# Patient Record
Sex: Male | Born: 2005 | Race: Black or African American | Hispanic: No | Marital: Single | Smoking: Never smoker
Health system: Southern US, Community
[De-identification: ages and names within clinical notes are randomized; demographics above are authoritative.]

---
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
## 2023-03-12 DIAGNOSIS — F32A Depression, unspecified: Secondary | ICD-10-CM | POA: Diagnosis not present

## 2023-04-02 DIAGNOSIS — F32A Depression, unspecified: Secondary | ICD-10-CM | POA: Diagnosis not present

## 2023-09-03 ENCOUNTER — Ambulatory Visit

## 2023-09-05 ENCOUNTER — Ambulatory Visit: Admitting: Student

## 2023-09-05 ENCOUNTER — Encounter: Payer: Self-pay | Admitting: Student

## 2023-09-05 VITALS — BP 119/69 | HR 82 | Ht 72.0 in | Wt 153.0 lb

## 2023-09-05 DIAGNOSIS — L309 Dermatitis, unspecified: Secondary | ICD-10-CM | POA: Diagnosis present

## 2023-09-05 MED ORDER — TRIAMCINOLONE ACETONIDE 0.5 % EX OINT: 1.0000 | TOPICAL_OINTMENT | Freq: Two times a day (BID) | CUTANEOUS | 0 refills | Status: DC

## 2023-09-05 NOTE — Progress Notes (Signed)
    SUBJECTIVE:   CHIEF COMPLAINT / HPI: Allergic rxn  Discussed the use of AI scribe software for clinical note transcription with the patient, who gave verbal consent to proceed.  History of Present Illness Frank Holden is a 18 year old male who presents with itchy skin lesions on his hands and right hip.  He has experienced itchy skin lesions on his hands for the past month and a half. The itchiness is more prominent than pain, and the lesions have not spread significantly. Hydrocortisone and antifungal creams have provided some improvement, but symptoms persist. The lesions do not bleed unless disturbed.  Similar itchy lesions are present on his hip area, with no other rashes or skin issues elsewhere on his body.  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 He has been in this job for approximately seven months.   PERTINENT  PMH / PSH: depression, allergic rhinitis  OBJECTIVE:   BP 119/69   Pulse 82   Ht 6' (1.829 m)   Wt 153 lb (69.4 kg)   SpO2 99%   BMI 20.75 kg/m   General: Well appearing, NAD, awake, alert, responsive to questions Head: Normocephalic atraumatic Respiratory: chest rises symmetrically,  no increased work of breathing Skin: intertriginous scaly lesions, dorsal surface of hand with scaly rash of hands. 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 Partial improvement with hydrocortisone and antifungal creams. Ddx dyshidrotic eczema vs contact dermatitis from cleaning chemicals. 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

## 2023-09-05 NOTE — Patient Instructions (Signed)
 It was great to see you! Thank you for allowing me to participate in your care!   Our plans for today:  - I have sent in triamcinolone  to do 2 times daily on hands - This appears to be contact related possibly from chemical, possibly from prolonged water exposure. 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  Genora Kidd, MD

## 2023-11-23 ENCOUNTER — Other Ambulatory Visit: Payer: Self-pay

## 2023-11-23 ENCOUNTER — Ambulatory Visit (HOSPITAL_COMMUNITY)
Admission: EM | Admit: 2023-11-23 | Discharge: 2023-11-24 | Disposition: A | Discharge status: Other Acute Inpatient Hospital | Attending: Nurse Practitioner | Admitting: Nurse Practitioner

## 2023-11-23 DIAGNOSIS — F419 Anxiety disorder, unspecified: Secondary | ICD-10-CM | POA: Insufficient documentation

## 2023-11-23 DIAGNOSIS — F332 Major depressive disorder, recurrent severe without psychotic features: Secondary | ICD-10-CM | POA: Insufficient documentation

## 2023-11-23 DIAGNOSIS — I4510 Unspecified right bundle-branch block: Secondary | ICD-10-CM | POA: Insufficient documentation

## 2023-11-23 DIAGNOSIS — R45851 Suicidal ideations: Secondary | ICD-10-CM | POA: Insufficient documentation

## 2023-11-23 DIAGNOSIS — Z9151 Personal history of suicidal behavior: Secondary | ICD-10-CM | POA: Insufficient documentation

## 2023-11-23 LAB — POCT URINE DRUG SCREEN - MANUAL ENTRY (I-SCREEN)
POC Amphetamine UR: NOT DETECTED
POC Buprenorphine (BUP): NOT DETECTED
POC Cocaine UR: NOT DETECTED
POC Marijuana UR: NOT DETECTED
POC Methadone UR: NOT DETECTED
POC Methamphetamine UR: NOT DETECTED
POC Morphine: NOT DETECTED
POC Oxazepam (BZO): NOT DETECTED
POC Oxycodone UR: NOT DETECTED
POC Secobarbital (BAR): NOT DETECTED

## 2023-11-23 MED ORDER — HYDROXYZINE HCL 25 MG PO TABS: 25.0000 mg | ORAL_TABLET | Freq: Three times a day (TID) | ORAL | Status: DC | PRN

## 2023-11-23 MED ORDER — DIPHENHYDRAMINE HCL 50 MG PO CAPS: 50.0000 mg | ORAL_CAPSULE | Freq: Three times a day (TID) | ORAL | Status: DC | PRN

## 2023-11-23 MED ORDER — DIPHENHYDRAMINE HCL 50 MG/ML IJ SOLN: 50.0000 mg | Freq: Three times a day (TID) | INTRAMUSCULAR | Status: DC | PRN

## 2023-11-23 MED ORDER — MAGNESIUM HYDROXIDE 400 MG/5ML PO SUSP: 30.0000 mL | Freq: Every day | ORAL | Status: DC | PRN

## 2023-11-23 MED ORDER — LORAZEPAM 2 MG/ML IJ SOLN: 2.0000 mg | Freq: Three times a day (TID) | INTRAMUSCULAR | Status: DC | PRN

## 2023-11-23 MED ORDER — TRAZODONE HCL 50 MG PO TABS
50.0000 mg | ORAL_TABLET | Freq: Every evening | ORAL | Status: DC | PRN
  Filled 2023-11-23: qty 1

## 2023-11-23 MED ORDER — ACETAMINOPHEN 325 MG PO TABS: 650.0000 mg | ORAL_TABLET | Freq: Four times a day (QID) | ORAL | Status: DC | PRN

## 2023-11-23 MED ORDER — ALUM & MAG HYDROXIDE-SIMETH 200-200-20 MG/5ML PO SUSP: 30.0000 mL | ORAL | Status: DC | PRN

## 2023-11-23 MED ORDER — HALOPERIDOL LACTATE 5 MG/ML IJ SOLN: 10.0000 mg | Freq: Three times a day (TID) | INTRAMUSCULAR | Status: DC | PRN

## 2023-11-23 MED ORDER — ARIPIPRAZOLE 5 MG PO TABS
5.0000 mg | ORAL_TABLET | Freq: Every day | ORAL | Status: DC
  Administered 2023-11-23: 5 mg via ORAL
  Filled 2023-11-23: qty 1

## 2023-11-23 MED ORDER — MELATONIN 5 MG PO TABS
5.0000 mg | ORAL_TABLET | Freq: Every evening | ORAL | Status: DC | PRN
Start: 2023-11-23 — End: 2023-11-24
  Administered 2023-11-23: 5 mg via ORAL
  Filled 2023-11-23: qty 1

## 2023-11-23 MED ORDER — HALOPERIDOL 5 MG PO TABS: 5.0000 mg | ORAL_TABLET | Freq: Three times a day (TID) | ORAL | Status: DC | PRN

## 2023-11-23 MED ORDER — HALOPERIDOL LACTATE 5 MG/ML IJ SOLN: 5.0000 mg | Freq: Three times a day (TID) | INTRAMUSCULAR | Status: DC | PRN

## 2023-11-23 NOTE — BH Assessment (Signed)
 Comprehensive Clinical Assessment (CCA) Note  11/23/2023 Frank Holden [id number]  DISPOSITION: Completed CCA accompanied by Roxianne Olp, NP who completed MSE and determined Pt meets criteria for inpatient psychiatric treatment. Pt will be admitted to [facility] for continuous assessment and AC at [facility] will review for possible admission.   The patient demonstrates the following risk factors for suicide: Chronic risk factors for suicide include: previous suicide attempts by hanging. Acute risk factors for suicide include: loss (financial, interpersonal, professional). Protective factors for this patient include: responsibility to others (children, family). Considering these factors, the overall suicide risk at this point appears to be high. Patient is not appropriate for outpatient follow up.  Pt presents to [facility] accompanied by his mother, who did not participate in assessment. Pt states he has experienced depressive symptoms since his great grandmother died 5 years ago. He says he has felt severely depressed for the past two months. He acknowledges that today he wrote a suicide note that read along the lines of it will all be over soon and I'm sorry and his sister saw it. He acknowledges suicidal ideation with no specific plan. He says approximately 1 year ago he attempted suicide by hanging himself, explaining he wrapped a belt around his neck and a doorknob but stopped himself.   Pt describes his mood as depressed and he appears anxious. He acknowledges crying spells, social withdrawal, and feelings of hopelessness. He says he has racing thoughts and frequently worries. He states he often stays awake until 5 am and will sleep until noon. He denies problems with appetite. He denies homicidal ideation or history of violence. He denies experiencing auditory or visual hallucinations. He states he tried marijuana a couple of months ago but denies recent use. He denies use of alcohol or other  substances.  Pt identifies grieving the death of his great grandmother as his primary stressor. He lives with his mother, stepfather and three younger siblings. He says his mother has a history of depression and he does not know any other family history. He graduated from high school and works at [employer]. He says he distracts himself from depressive thoughts by playing video games or watching YouTube. He denies history of abuse. He denies any legal history. He denies access to firearms.   Pt says he participated in therapy for the approximately 6 months starting in November 2024. He found it somewhat helpful. He says he has never been prescribed psychiatric medications. He has no history of inpatient psychiatric treatment.  Pt is casually dressed and well-groomed. He is alert and oriented x4. Pt speaks in a clear tone, at moderate volume and normal pace. Motor behavior appears mildly tremulous. Eye contact is good. Pt's mood is depressed and affect is anxious and depressed. Thought process is coherent and relevant. There is no indication he is currently responding to internal stimuli or experiencing delusional thought content. He is cooperative and says he is willing to sign voluntarily into a psychiatric facility and willing to take medication because he wants to improve his life.  Chief Complaint:  Chief Complaint  Patient presents with   Suicidal Ideation   Visit Diagnosis: F33.2 Major depressive disorder, Recurrent episode, Severe   CCA Screening, Triage and Referral (STR)  Patient Reported Information How did you hear about us ? Family/Friend  What Is the Reason for Your Visit/Call Today? Pt presents to [facility] as a voluntary walk-in, accompanied by his mother with complaint of SI, with no plan/intent. Pt reports that he wrote  a note earlier today and his sister saw it. He states that note read along the lines of it will all be over soon and I'm sorry. Pt reports having ongoing SI  since the passing of his grandmother. Pt reports prior suicide attempt by hanging, but he was unable to remember when that occurred.  How Long Has This Been Causing You Problems? 1-6 months  What Do You Feel Would Help You the Most Today? Treatment for Depression or other mood problem   Have You Recently Had Any Thoughts About Hurting Yourself? Yes  Are You Planning to Commit Suicide/Harm Yourself At This time? Yes   Flowsheet Row ED from 11/23/2023 in [facility] ED to Hosp-Admission (Discharged) from 12/04/2020 in [facility]  C-SSRS RISK CATEGORY Moderate Risk No Risk    Have you Recently Had Thoughts About Hurting Someone [place]? No  Are You Planning to Harm Someone at This Time? No  Explanation: Pt reports suicidal ideation and acknowledges he wrote a suicide note earlier today. He does not articulate a specific plan but attempted to hang himself last year.   Have You Used Any Alcohol or Drugs in the Past 24 Hours? No  How Long Ago Did You Use Drugs or Alcohol? No data recorded What Did You Use and How Much? No data recorded  Do You Currently Have a Therapist/Psychiatrist? No  Name of Therapist/Psychiatrist:    Have You Been Recently Discharged From Any Office Practice or Programs? No  Explanation of Discharge From Practice/Program: No data recorded    CCA Screening Triage Referral Assessment Type of Contact: Face-to-Face  Telemedicine Service Delivery:   Is this Initial or Reassessment?   Date Telepsych consult ordered in [facility]:    Time Telepsych consult ordered in [facility]:    Location of Assessment: [facility] Assessment Services  Provider Location: [facility] Assessment [facility]   Collateral Involvement: None   Does Patient Have a Automotive engineer Guardian? No  Legal Guardian Contact Information: Pt does not have a legal guardian  Copy of Legal Guardianship Form: -- (Pt does not have a legal guardian)  Legal  Guardian Notified of Arrival: -- (Pt does not have a legal guardian)  Legal Guardian Notified of Pending Discharge: -- (Pt does not have a legal guardian)  If Minor and Not Living with Parent(s), Who has Custody? Pt is an adult  Is CPS involved or ever been involved? Never  Is APS involved or ever been involved? Never   Patient Determined To Be At Risk for Harm To Self or Others Based on Review of Patient Reported Information or Presenting Complaint? Yes, for Self-Harm (Pt reports suicidal ideation and acknowledges he wrote a suicide note earlier today. He does not articulate a specific plan but attempted to hang himself last year. He denies thoughts of harming others.)  Method: No Plan  Availability of Means: No access or NA  Intent: Clearly intends on inflicting harm that could cause death  Notification Required: No need or identified person  Additional Information for Danger to Others Potential: -- (No history of violence)  Additional Comments for Danger to Others Potential: Pt has no history of violence  Are There Guns or Other Weapons in Your Home? No  Types of Guns/Weapons: Pt denies access to firearms.  Are These Weapons Safely Secured?                            -- (  Pt denies access to firearms.)  Who Could Verify You Are Able To Have These Secured: Pt denies access to firearms.  Do You Have any Outstanding Charges, Pending Court Dates, Parole/Probation? Pt denies legal history.  Contacted To Inform of Risk of Harm To Self or Others: Family/Significant Other:    Does Patient Present under Involuntary Commitment? No    [place] of Residence: [place]   Patient Currently Receiving the Following Services: Not Receiving Services   Determination of Need: Urgent (48 hours)   Options For Referral: Inpatient Hospitalization; [facility] Urgent Care     CCA Biopsychosocial Patient Reported Schizophrenia/Schizoaffective Diagnosis in Past: No   Strengths: Pt is  motivated for treatment and has family support   Mental Health Symptoms Depression:  Change in energy/activity; Hopelessness   Duration of Depressive symptoms: Duration of Depressive Symptoms: Greater than two weeks   Mania:  None   Anxiety:   Sleep; Tension; Worrying   Psychosis:  None   Duration of Psychotic symptoms:    Trauma:  None   Obsessions:  None   Compulsions:  None   Inattention:  None   Hyperactivity/Impulsivity:  None   Oppositional/Defiant Behaviors:  None   Emotional Irregularity:  None   Other Mood/Personality Symptoms:  None noted    Mental Status Exam Appearance and self-care  Stature:  Average   Weight:  Average weight   Clothing:  Casual   Grooming:  Well-groomed   Cosmetic use:  Age appropriate   Posture/gait:  Normal   Motor activity:  Not Remarkable   Sensorium  Attention:  Normal   Concentration:  Normal   Orientation:  X5   Recall/memory:  Normal   Affect and Mood  Affect:  Anxious; Depressed   Mood:  Anxious; Depressed   Relating  Eye contact:  Normal   Facial expression:  Anxious   Attitude toward examiner:  Cooperative   Thought and Language  Speech flow: Normal   Thought content:  Appropriate to Mood and Circumstances   Preoccupation:  None   Hallucinations:  None   Organization:  Coherent   [employer] of Knowledge:  Average   Intelligence:  Average   Abstraction:  Normal   Judgement:  Normal   Reality Testing:  Realistic   Insight:  Fair   Decision Making:  Normal   Social Functioning  Social Maturity:  Responsible   Social Judgement:  Normal   Stress  Stressors:  Grief/losses   Coping Ability:  Normal   Skill Deficits:  None   Supports:  Family; Friends/Service system     Religion: Religion/Spirituality Are You A Religious Person?: Yes What is Your Religious Affiliation?: Christian How Might This Affect Treatment?: Unknown  Leisure/Recreation: Leisure /  Recreation Do You Have Hobbies?: Yes Leisure and Hobbies: Video games, watching YouTube  Exercise/Diet: Exercise/Diet Do You Exercise?: Yes What Type of Exercise Do You Do?: Weight Training How Many Times a Week Do You Exercise?: 1-3 times a week Have You Gained or Lost A Significant Amount of Weight in the Past Six Months?: No Do You Follow a Special Diet?: No Do You Have Any Trouble Sleeping?: Yes Explanation of Sleeping Difficulties: Pt says he does not sleep until 5am and will sleep until noon   CCA Employment/Education Employment/Work Situation: Employment / Work Situation Employment Situation: Employed Work Stressors: None reported Patient's Job has Been Impacted by Current Illness: No Has Patient ever Been in Equities trader?: No  Education: Education Is Patient Currently Attending School?:  No Last Grade Completed: 12 Did You Attend College?: No Did You Have An Individualized Education Program (IIEP): No Did You Have Any Difficulty At School?: No Patient's Education Has Been Impacted by Current Illness: No   CCA Family/Childhood History Family and Relationship History: Family history Marital status: Single Does patient have children?: No  Childhood History:  Childhood History By whom was/is the patient raised?: Mother/father and step-parent, Mother Did patient suffer any verbal/emotional/physical/sexual abuse as a child?: No Did patient suffer from severe childhood neglect?: No Has patient ever been sexually abused/assaulted/raped as an adolescent or adult?: No Was the patient ever a victim of a crime or a disaster?: No Witnessed domestic violence?: No Has patient been affected by domestic violence as an adult?: No       CCA Substance Use Alcohol/Drug Use: Alcohol / Drug Use Pain Medications: Denies abuse Prescriptions: Denies abuse Over the Counter: Denies abuse History of alcohol / drug use?: Yes Longest period of sobriety (when/how long):  NA Negative Consequences of Use:  (NA) Withdrawal Symptoms: None                         ASAM's:  Six Dimensions of Multidimensional Assessment  Dimension 1:  Acute Intoxication and/or Withdrawal Potential:   Dimension 1:  Description of individual's past and current experiences of substance use and withdrawal: Pt says he tried marijuana a couple of months ago.  Dimension 2:  Biomedical Conditions and Complications:   Dimension 2:  Description of patient's biomedical conditions and  complications: None  Dimension 3:  Emotional, Behavioral, or Cognitive Conditions and Complications:  Dimension 3:  Description of emotional, behavioral, or cognitive conditions and complications: Pt reports depressive symptoms  Dimension 4:  Readiness to Change:  Dimension 4:  Description of Readiness to Change criteria: It is not currently using any substances  Dimension 5:  Relapse, Continued use, or Continued Problem Potential:  Dimension 5:  Relapse, continued use, or continued problem potential critiera description: It is not currently using any substances  Dimension 6:  Recovery/Living Environment:  Dimension 6:  Recovery/Iiving environment criteria description: Lives with mother, stepfather, siblings.  ASAM Severity Score: ASAM's Severity Rating Score: 3  ASAM Recommended Level of Treatment: ASAM Recommended Level of Treatment:  (NA)   Substance use Disorder (SUD) Substance Use Disorder (SUD)  Checklist Symptoms of Substance Use:  (NA)  Recommendations for Services/Supports/Treatments: Recommendations for Services/Supports/Treatments Recommendations For Services/Supports/Treatments: Inpatient Hospitalization  Disposition Recommendation per psychiatric provider: We recommend inpatient psychiatric hospitalization when medically cleared. Patient is under voluntary admission status at this time; please IVC if attempts to leave hospital.   DSM5 Diagnoses: Patient Active Problem List    Diagnosis Date Noted   Mood changes 07/29/2022   Status post surgery 12/04/2020   Acute appendicitis 12/04/2020   Allergic rhinitis 11/09/2011   Snoring 04/05/2009     Referrals to Alternative Service(s): Referred to Alternative Service(s):   Place:   Date:   Time:    Referred to Alternative Service(s):   Place:   Date:   Time:    Referred to Alternative Service(s):   Place:   Date:   Time:    Referred to Alternative Service(s):   Place:   Date:   Time:     Vivi Mickey Prim[place], [facility]

## 2023-11-23 NOTE — ED Provider Notes (Signed)
 [facility] Continuous Assessment Admission H&P  Date: 11/23/23 Patient Name: Danon Cardenas MRN: [id number] Chief Complaint: suicidal ideation  Diagnoses:  Final diagnoses:  Severe episode of recurrent major depressive disorder, without psychotic features (HCC)    HPI: Yurem Korver is a 18 y/o male with psychiatric history of depression presenting to [facility] as a walk in voluntarily accompanied by his mother Alberta Cairns with complaints of worsening depression/anxiety symptoms and suicidal ideation over the last few months. Patient wrote a suicidal note this morning stating it will all be over soon.    Chaise Busk, 18 y.o., male patient seen face to face by this provider and chart reviewed on 11/23/23.  On evaluation Aadin Gaut reports that he has been feeling depressed for about 5 years which is when his great-grandmother passed away. Patient stated, my mental health is getting worse. Patient reports that he was in therapy for about 6 months but it was only somewhat helpful. Patient reports that he attempted to hang himself last year with tying a belt around his neck and his door. Patient states that he did not disclose to anyone that   Patient reports that he tries to distract himself by playing video games. Patient reports poor sleep staying up to 4-5 am each morning due to racing thoughts, self-isolation, anhedonia, hopelessness and worthlessness, crying spells and irritability. Patient denies any previous trauma history. Patient does not have an outpatient medication provider or therapist at this time. Patient denies ever being prescribed psychiatric medications. Patient reports that he used to smoke marijuana but he stopped smoking a few month ago. Patient denies any other illicit substances.   During evaluation Tanush Hegel is sitting in the assessment in no acute distress.  He is alert, oriented x 4, calm, cooperative, pleasant and attentive.  His mood is very depressed and  anxious with congruent affect.  Patient appears visibly nervous during the assessment with his leg shaking during most of the assessment. Patient speaks at a low volume and behavior is calm.  Objectively there is no evidence of psychosis/mania or delusional thinking.  Patient is able to converse coherently, goal directed thoughts, no distractibility, or pre-occupation. He endorses suicidal ideation at this time but no specific plan or intent. Patient denies any psychosis or paranoia.  Patient answered question appropriately.     Total Time spent with patient: 20 minutes  Musculoskeletal  Strength & Muscle Tone: within normal limits Gait & Station: normal Patient leans: N/A  Psychiatric Specialty Exam  Presentation General Appearance:  Casual  Eye Contact: Good  Speech: Clear and Coherent  Speech Volume: Normal  Handedness: Right   Mood and Affect  Mood: Depressed  Affect: Appropriate   Thought Process  Thought Processes: Coherent  Descriptions of Associations:Intact  Orientation:Full (Time, Place and Person)  Thought Content:WDL  Diagnosis of Schizophrenia or Schizoaffective disorder in past: No   Hallucinations:Hallucinations: None  Ideas of Reference:None  Suicidal Thoughts:Suicidal Thoughts: Yes, Passive SI Passive Intent and/or Plan: Without Intent; Without Plan  Homicidal Thoughts:Homicidal Thoughts: No   Sensorium  Memory: Immediate Fair; Recent Fair; Remote Fair  Judgment: Fair  Insight: Fair   Executive Functions  Concentration: Good  Attention Span: Good  Recall: Good  Fund of Knowledge: Good  Language: Good   Psychomotor Activity  Psychomotor Activity: Psychomotor Activity: Normal   Assets  Assets: Communication Skills; Resilience; Housing; Social Support; Financial Resources/Insurance   Sleep  Sleep: Sleep: Poor Number of Hours of Sleep: 4   Nutritional Assessment (For  OBS and FBC admissions only) Has the  patient had a weight loss or gain of 10 pounds or more in the last 3 months?: No Has the patient had a decrease in food intake/or appetite?: No Does the patient have dental problems?: No Does the patient have eating habits or behaviors that may be indicators of an eating disorder including binging or inducing vomiting?: No Has the patient recently lost weight without trying?: 0 Has the patient been eating poorly because of a decreased appetite?: 1 Malnutrition Screening Tool Score: 1    Physical Exam HENT:     Head: Normocephalic.     Nose: Nose normal.  Eyes:     Pupils: Pupils are equal, round, and reactive to light.  Cardiovascular:     Rate and Rhythm: Normal rate.  Pulmonary:     Effort: Pulmonary effort is normal.  Abdominal:     General: Abdomen is flat.  Musculoskeletal:        General: Normal range of motion.     Cervical back: Normal range of motion.  Skin:    General: Skin is warm.  Neurological:     Mental Status: He is alert and oriented to person, place, and time.  Psychiatric:        Attention and Perception: Attention normal.        Mood and Affect: Mood is anxious and depressed.        Speech: Speech normal.        Behavior: Behavior is cooperative.        Thought Content: Thought content is not paranoid or delusional. Thought content includes suicidal ideation. Thought content does not include homicidal ideation. Thought content does not include homicidal or suicidal plan.        Cognition and Memory: Cognition normal.        Judgment: Judgment is impulsive.    Review of Systems  Constitutional: Negative.   HENT: Negative.    Eyes: Negative.   Respiratory: Negative.    Cardiovascular: Negative.   Gastrointestinal: Negative.   Genitourinary: Negative.   Musculoskeletal: Negative.   Skin: Negative.   Neurological: Negative.   Psychiatric/Behavioral:  Positive for depression and suicidal ideas. The patient is nervous/anxious.     Blood pressure  120/84, pulse 73, temperature 98.1 F (36.7 C), temperature source Oral, resp. rate 20, SpO2 100%. There is no height or weight on file to calculate BMI.  Past Psychiatric History: Depression  Is the patient at risk to self? Yes  Has the patient been a risk to self in the past 6 months? No .    Has the patient been a risk to self within the distant past? Yes   Is the patient a risk to others? No   Has the patient been a risk to others in the past 6 months? No   Has the patient been a risk to others within the distant past? No   Past Medical History: Appendectomy  Family History: Mother-depression/anxiety  Social History: 18 y/o male lives at home with his mother and step-father and three younger siblings and works at [employer], high school graduate  Last Labs:  No visits with results within 6 Month(s) from this visit.  Latest known visit with results is:  Admission on 12/04/2020, Discharged on 12/07/2020  Component Date Value Ref Range Status   Lipase 12/04/2020 25  11 - 51 U/L Final   Performed at [facility], [address], [place], [place] [address]  Sodium 12/04/2020 133 (L)  135 - 145 mmol/L Final   Potassium 12/04/2020 4.6  3.5 - 5.1 mmol/L Final   Chloride 12/04/2020 97 (L)  98 - 111 mmol/L Final   CO2 12/04/2020 23  22 - 32 mmol/L Final   Glucose, Bld 12/04/2020 142 (H)  70 - 99 mg/dL Final   Glucose reference range applies only to samples taken after fasting for at least 8 hours.   BUN 12/04/2020 13  4 - 18 mg/dL Final   Creatinine, Ser 12/04/2020 1.12 (H)  0.50 - 1.00 mg/dL Final   Calcium 92/68/7977 9.3  8.9 - 10.3 mg/dL Final   Total Protein 92/68/7977 8.7 (H)  6.5 - 8.1 g/dL Final   Albumin  12/04/2020 4.8  3.5 - 5.0 g/dL Final   AST 92/68/7977 31  15 - 41 U/L Final   ALT 12/04/2020 14  0 - 44 U/L Final   Alkaline Phosphatase 12/04/2020 121  52 - 171 U/L Final   Total Bilirubin 12/04/2020 1.3 (H)  0.3 - 1.2 mg/dL Final   GFR, Estimated  12/04/2020 NOT CALCULATED  >60 mL/min Final   Comment: (NOTE) Calculated using the CKD-EPI Creatinine Equation (2021)    Anion gap 12/04/2020 13  5 - 15 Final   Performed at [facility], [address] [place], [place] [address]   WBC 12/04/2020 5.4  4.5 - 13.5 K/uL Final   RBC 12/04/2020 5.62  3.80 - 5.70 MIL/uL Final   Hemoglobin 12/04/2020 15.1  12.0 - 16.0 g/dL Final   HCT 92/68/7977 46.6  36.0 - 49.0 % Final   MCV 12/04/2020 82.9  78.0 - 98.0 fL Final   MCH 12/04/2020 26.9  25.0 - 34.0 pg Final   MCHC 12/04/2020 32.4  31.0 - 37.0 g/dL Final   RDW 92/68/7977 13.3  11.4 - 15.5 % Final   Platelets 12/04/2020 234  150 - 400 K/uL Final   nRBC 12/04/2020 0.0  0.0 - 0.2 % Final   Performed at [facility], [address] [place], [place] [address]   Color, Urine 12/05/2020 YELLOW  YELLOW Final   APPearance 12/05/2020 CLEAR  CLEAR Final   Specific Gravity, Urine 12/05/2020 >1.046 (H)  1.005 - 1.030 Final   pH 12/05/2020 6.0  5.0 - 8.0 Final   Glucose, UA 12/05/2020 NEGATIVE  NEGATIVE mg/dL Final   Hgb urine dipstick 12/05/2020 NEGATIVE  NEGATIVE Final   Bilirubin Urine 12/05/2020 NEGATIVE  NEGATIVE Final   Ketones, ur 12/05/2020 NEGATIVE  NEGATIVE mg/dL Final   Protein, ur 91/98/7977 30 (A)  NEGATIVE mg/dL Final   Nitrite 91/98/7977 NEGATIVE  NEGATIVE Final   Leukocytes,Ua 12/05/2020 NEGATIVE  NEGATIVE Final   RBC / HPF 12/05/2020 0-5  0 - 5 RBC/hpf Final   WBC, UA 12/05/2020 0-5  0 - 5 WBC/hpf Final   Bacteria, UA 12/05/2020 NONE SEEN  NONE SEEN Final   Squamous Epithelial / HPF 12/05/2020 0-5  0 - 5 Final   Mucus 12/05/2020 PRESENT   Final   Performed at [facility], [address], [place], [place] [address]   SARS Coronavirus 2 by RT PCR 12/04/2020 NEGATIVE  NEGATIVE Final   Comment: (NOTE) SARS-CoV-2 target nucleic acids are NOT DETECTED.  The SARS-CoV-2 RNA is generally detectable in upper respiratory specimens during the acute phase of  infection. The lowest concentration of SARS-CoV-2 viral copies this assay can detect is 138 copies/mL. A negative result does not preclude SARS-Cov-2 infection and  should not be used as the sole basis for treatment or other patient management decisions. A negative result may occur with  improper specimen collection/handling, submission of specimen other than nasopharyngeal swab, presence of viral mutation(s) within the areas targeted by this assay, and inadequate number of viral copies(<138 copies/mL). A negative result must be combined with clinical observations, patient history, and epidemiological information. The expected result is Negative.  Fact Sheet for Patients:  BloggerCourse.com  Fact Sheet for Healthcare Providers:  SeriousBroker.it  This test is no                          t yet approved or cleared by the [place]  FDA and  has been authorized for detection and/or diagnosis of SARS-CoV-2 by FDA under an Emergency Use Authorization (EUA). This EUA will remain  in effect (meaning this test can be used) for the duration of the COVID-19 declaration under Section 564(b)(1) of the Act, 21 U.S.C.section 360bbb-3(b)(1), unless the authorization is terminated  or revoked sooner.       Influenza A by PCR 12/04/2020 NEGATIVE  NEGATIVE Final   Influenza B by PCR 12/04/2020 NEGATIVE  NEGATIVE Final   Comment: (NOTE) The Xpert Xpress SARS-CoV-2/FLU/RSV plus assay is intended as an aid in the diagnosis of influenza from Nasopharyngeal swab specimens and should not be used as a sole basis for treatment. Nasal washings and aspirates are unacceptable for Xpert Xpress SARS-CoV-2/FLU/RSV testing.  Fact Sheet for Patients: BloggerCourse.com  Fact Sheet for Healthcare Providers: SeriousBroker.it  This test is not yet approved or cleared by the [place]  FDA and has been  authorized for detection and/or diagnosis of SARS-CoV-2 by FDA under an Emergency Use Authorization (EUA). This EUA will remain in effect (meaning this test can be used) for the duration of the COVID-19 declaration under Section 564(b)(1) of the Act, 21 U.S.C. section 360bbb-3(b)(1), unless the authorization is terminated or revoked.     Resp Syncytial Virus by PCR 12/04/2020 NEGATIVE  NEGATIVE Final   Comment: (NOTE) Fact Sheet for Patients: BloggerCourse.com  Fact Sheet for Healthcare Providers: SeriousBroker.it  This test is not yet approved or cleared by the [place]  FDA and has been authorized for detection and/or diagnosis of SARS-CoV-2 by FDA under an Emergency Use Authorization (EUA). This EUA will remain in effect (meaning this test can be used) for the duration of the COVID-19 declaration under Section 564(b)(1) of the Act, 21 U.S.C. section 360bbb-3(b)(1), unless the authorization is terminated or revoked.  Performed at [facility], [address] [place], [place] [address]    Sodium 12/05/2020 134 (L)  135 - 145 mmol/L Final   Potassium 12/05/2020 3.8  3.5 - 5.1 mmol/L Final   Chloride 12/05/2020 102  98 - 111 mmol/L Final   CO2 12/05/2020 26  22 - 32 mmol/L Final   Glucose, Bld 12/05/2020 122 (H)  70 - 99 mg/dL Final   Glucose reference range applies only to samples taken after fasting for at least 8 hours.   BUN 12/05/2020 11  4 - 18 mg/dL Final   Creatinine, Ser 12/05/2020 1.03 (H)  0.50 - 1.00 mg/dL Final   Calcium 91/98/7977 8.6 (L)  8.9 - 10.3 mg/dL Final   GFR, Estimated 12/05/2020 NOT CALCULATED  >60 mL/min Final   Comment: (NOTE) Calculated using the CKD-EPI Creatinine Equation (2021)    Anion gap 12/05/2020 6  5 - 15 Final   Performed at [facility], [address], [place], [place] [address]   SURGICAL PATHOLOGY 12/05/2020    Final-Edited                   Value:SURGICAL  PATHOLOGY CASE: [id number] PATIENT: LAMONTE HOYLE Surgical Pathology Report     Clinical History: Appendicitis (nt)     FINAL MICROSCOPIC DIAGNOSIS:  A. APPENDIX, APPENDECTOMY: - Acute appendicitis with perforation and serositis.   GROSS DESCRIPTION:  Specimen: Appendix, received fresh Size: 7.7 cm in length and up to 1 cm in diameter Serosa: The serosa is dull tan-red and partially covered by an exudate. Mucosa: Focally hemorrhagic Wall: There is a perforation of the wall in the midportion. Lumen: Patent Block Summary: 1 block submitted to include the inked margin (GRP 12/05/2020)    Final Diagnosis performed by Recardo Likens, MD.   Electronically signed 12/06/2020 Technical component performed at [facility]. [facility], [address] [place], [place] [address].  Professional component performed at [facility], [address], [place], [place] [address].  Immunohistochemistry Technical component (if applicable) w                         as performed at [facility]. [address], [place], [place] [address].   IMMUNOHISTOCHEMISTRY DISCLAIMER (if applicable): Some of these immunohistochemical stains may have been developed and the performance characteristics determine by [facility]. Some may not have been cleared or approved by the U.S. Food and Drug Administration. The FDA has determined that such clearance or approval is not necessary. This test is used for clinical purposes. It should not be regarded as investigational or for research. This laboratory is certified under the Clinical Laboratory Improvement Amendments of 1988 (CLIA-88) as qualified to perform high complexity clinical laboratory testing.  The controls stained appropriately.    WBC 12/06/2020 5.5  4.5 - 13.5 K/uL Final   RBC 12/06/2020 3.84  3.80 - 5.70 MIL/uL Final   Hemoglobin 12/06/2020 10.6 (L)  12.0 - 16.0 g/dL Final   HCT  91/97/7977 32.3 (L)  36.0 - 49.0 % Final   MCV 12/06/2020 84.1  78.0 - 98.0 fL Final   MCH 12/06/2020 27.6  25.0 - 34.0 pg Final   MCHC 12/06/2020 32.8  31.0 - 37.0 g/dL Final   RDW 91/97/7977 13.4  11.4 - 15.5 % Final   Platelets 12/06/2020 169  150 - 400 K/uL Final   nRBC 12/06/2020 0.0  0.0 - 0.2 % Final   Performed at [facility], [address], [place], [place] [address]    Allergies: Patient has no known allergies.  Medications:  Facility Ordered Medications  Medication   acetaminophen  (TYLENOL ) tablet 650 mg   alum & mag hydroxide-simeth (MAALOX/MYLANTA) 200-200-20 MG/5ML suspension 30 mL   magnesium  hydroxide (MILK OF MAGNESIA) suspension 30 mL   haloperidol  (HALDOL ) tablet 5 mg   And   diphenhydrAMINE  (BENADRYL ) capsule 50 mg   haloperidol  lactate (HALDOL ) injection 5 mg   And   diphenhydrAMINE  (BENADRYL ) injection 50 mg   And   LORazepam  (ATIVAN ) injection 2 mg   haloperidol  lactate (HALDOL ) injection 10 mg   And   diphenhydrAMINE  (BENADRYL ) injection 50 mg   And   LORazepam  (ATIVAN ) injection 2 mg   hydrOXYzine  (ATARAX ) tablet 25 mg   traZODone  (DESYREL ) tablet 50 mg   PTA Medications  Medication Sig   loratadine  (CLARITIN ) 10 MG tablet Take 1  tablet (10 mg total) by mouth daily.   acetaminophen  (TYLENOL ) 500 MG tablet Take 2 tablets (1,000 mg total) by mouth every 6 (six) hours as needed.   triamcinolone  ointment (KENALOG ) 0.5 % Apply 1 Application topically 2 (two) times daily. For moderate to severe eczema.  Do not use for more than 1 week at a time.      Medical Decision Making  Aldred Rutledge is a 18 y/o male presenting to [facility] as a walk in voluntarily accompanied by his mother Alric Geise with complaints of worsening depression/anxiety symptoms and suicidal ideation over the last few months. Patient wrote a suicidal note this morning stating it will all be over soon    Recommendations  Based on my evaluation the patient does not appear  to have an emergency medical condition. Patient recommended for inpatient hospitalization and will be admitted to [facility] continuous observation for crisis management, safety and stabilization until an inpatient bed is available.   Amyrie Illingworth E Alfonzia Woolum, NP 11/23/23  10:30 PM

## 2023-11-23 NOTE — Progress Notes (Signed)
   11/23/23 2112  [facility] Triage Screening (Walk-ins at [facility] only)  How Did You Hear About Us ? Family/Friend  What Is the Reason for Your Visit/Call Today? Pt presents to [facility] as a voluntary walk-in, accompanied by his mother with complaint of SI, with no plan/intent. Pt reports that he wrote a note earlier today and his sister saw it. He states that note read along the lines of it will all be over soon and I'm sorry. Pt denies having a plan, however he thinks about not being a lot more than he should.  Pt reports having ongoing SI since the passing of his grandmother. Pt reports prior suicide attempt by hanging, but he was unable to remember when that occurred. Pt is observed fidgety at times, lack of eye contact, but pleasant. Pt denies being established with outpatient resources. Pt denies taking prescribed medications at this time. Pt currently denies HI,AVH and substance/alcohol use.  How Long Has This Been Causing You Problems? 1-6 months  Have You Recently Had Any Thoughts About Hurting Yourself? Yes  How long ago did you have thoughts about hurting yourself? currently  Are You Planning to Commit Suicide/Harm Yourself At This time? No  Have you Recently Had Thoughts About Hurting Someone Sherral? No  Are You Planning To Harm Someone At This Time? No  Physical Abuse Denies  Verbal Abuse Denies  Sexual Abuse Denies  Exploitation of patient/patient's resources Denies  Self-Neglect Denies  Possible abuse reported to:  (NA)  Are you currently experiencing any auditory, visual or other hallucinations? No  Have You Used Any Alcohol or Drugs in the Past 24 Hours? No  Do you have any current medical co-morbidities that require immediate attention? No  Clinician description of patient physical appearance/behavior: Cooperative, lacks eye contact, appears fidgety or slightly paranoid  What Do You Feel Would Help You the Most Today? Treatment for Depression or other mood problem;Medication(s)  If access  to [facility] was not available, would you have sought care in the Emergency Department? Yes  Determination of Need Urgent (48 hours)  Options For Referral Other: Comment;[facility] Urgent Care;Outpatient Therapy;Medication Management  Determination of Need filed? Yes

## 2023-11-24 ENCOUNTER — Encounter: Payer: Self-pay | Admitting: Nurse Practitioner

## 2023-11-24 ENCOUNTER — Other Ambulatory Visit: Payer: Self-pay

## 2023-11-24 ENCOUNTER — Inpatient Hospital Stay
Admission: AD | Admit: 2023-11-24 | Discharge: 2023-11-29 | DRG: 885 | Disposition: A | Discharge status: Home / Self Care | Source: Intra-hospital | Attending: Psychiatry | Admitting: Psychiatry

## 2023-11-24 DIAGNOSIS — F333 Major depressive disorder, recurrent, severe with psychotic symptoms: Secondary | ICD-10-CM | POA: Diagnosis not present

## 2023-11-24 DIAGNOSIS — Z9151 Personal history of suicidal behavior: Secondary | ICD-10-CM | POA: Diagnosis not present

## 2023-11-24 DIAGNOSIS — F411 Generalized anxiety disorder: Secondary | ICD-10-CM | POA: Diagnosis present

## 2023-11-24 DIAGNOSIS — R45851 Suicidal ideations: Secondary | ICD-10-CM | POA: Diagnosis present

## 2023-11-24 DIAGNOSIS — F332 Major depressive disorder, recurrent severe without psychotic features: Secondary | ICD-10-CM | POA: Diagnosis present

## 2023-11-24 LAB — COMPREHENSIVE METABOLIC PANEL WITH GFR
ALT: 16 U/L (ref 0–44)
AST: 25 U/L (ref 15–41)
Albumin: 4.3 g/dL (ref 3.5–5.0)
Alkaline Phosphatase: 74 U/L (ref 38–126)
Anion gap: 10 (ref 5–15)
BUN: 8 mg/dL (ref 6–20)
CO2: 24 mmol/L (ref 22–32)
Calcium: 9.5 mg/dL (ref 8.9–10.3)
Chloride: 102 mmol/L (ref 98–111)
Creatinine, Ser: 1.12 mg/dL (ref 0.61–1.24)
GFR, Estimated: >60 mL/min (ref >60)
Glucose, Bld: 140 mg/dL — ABNORMAL HIGH (ref 70–99)
Potassium: 4 mmol/L (ref 3.5–5.1)
Sodium: 136 mmol/L (ref 135–145)
Total Bilirubin: 1 mg/dL (ref 0.0–1.2)
Total Protein: 7.2 g/dL (ref 6.5–8.1)

## 2023-11-24 LAB — CBC WITH DIFFERENTIAL/PLATELET
Abs Immature Granulocytes: 0.02 K/uL (ref 0.00–0.07)
Basophils Absolute: 0 K/uL (ref 0.0–0.1)
Basophils Relative: 0 %
Eosinophils Absolute: 0.2 K/uL (ref 0.0–0.5)
Eosinophils Relative: 4 %
HCT: 41 % (ref 39.0–52.0)
Hemoglobin: 13.6 g/dL (ref 13.0–17.0)
Immature Granulocytes: 0 %
Lymphocytes Relative: 20 %
Lymphs Abs: 1.1 K/uL (ref 0.7–4.0)
MCH: 27.2 pg (ref 26.0–34.0)
MCHC: 33.2 g/dL (ref 30.0–36.0)
MCV: 82 fL (ref 80.0–100.0)
Monocytes Absolute: 0.4 K/uL (ref 0.1–1.0)
Monocytes Relative: 7 %
Neutro Abs: 4 K/uL (ref 1.7–7.7)
Neutrophils Relative %: 69 %
Platelets: 227 K/uL (ref 150–400)
RBC: 5 MIL/uL (ref 4.22–5.81)
RDW: 13 % (ref 11.5–15.5)
WBC: 5.8 K/uL (ref 4.0–10.5)
nRBC: 0 % (ref 0.0–0.2)

## 2023-11-24 LAB — MAGNESIUM: Magnesium: 2.1 mg/dL (ref 1.7–2.4)

## 2023-11-24 LAB — ETHANOL: Alcohol, Ethyl (B): <15 mg/dL (ref <15)

## 2023-11-24 MED ORDER — ALUM & MAG HYDROXIDE-SIMETH 200-200-20 MG/5ML PO SUSP: 30.0000 mL | ORAL | Status: DC | PRN

## 2023-11-24 MED ORDER — ESCITALOPRAM OXALATE 10 MG PO TABS
10.0000 mg | ORAL_TABLET | Freq: Every day | ORAL | Status: DC
Start: 2023-11-24 — End: 2023-11-29
  Administered 2023-11-24 – 2023-11-29 (×6): 10 mg via ORAL
  Filled 2023-11-24 (×6): qty 1

## 2023-11-24 MED ORDER — DIPHENHYDRAMINE HCL 25 MG PO CAPS: 50.0000 mg | ORAL_CAPSULE | Freq: Three times a day (TID) | ORAL | Status: DC | PRN

## 2023-11-24 MED ORDER — HALOPERIDOL 5 MG PO TABS: 5.0000 mg | ORAL_TABLET | Freq: Three times a day (TID) | ORAL | Status: DC | PRN

## 2023-11-24 MED ORDER — HALOPERIDOL LACTATE 5 MG/ML IJ SOLN: 10.0000 mg | Freq: Three times a day (TID) | INTRAMUSCULAR | Status: DC | PRN

## 2023-11-24 MED ORDER — DIPHENHYDRAMINE HCL 50 MG/ML IJ SOLN: 50.0000 mg | Freq: Three times a day (TID) | INTRAMUSCULAR | Status: DC | PRN

## 2023-11-24 MED ORDER — ACETAMINOPHEN 325 MG PO TABS: 650.0000 mg | ORAL_TABLET | Freq: Four times a day (QID) | ORAL | Status: DC | PRN

## 2023-11-24 MED ORDER — MAGNESIUM HYDROXIDE 400 MG/5ML PO SUSP: 30.0000 mL | Freq: Every day | ORAL | Status: DC | PRN

## 2023-11-24 MED ORDER — ARIPIPRAZOLE 5 MG PO TABS
5.0000 mg | ORAL_TABLET | Freq: Every day | ORAL | Status: DC
  Administered 2023-11-24 – 2023-11-29 (×6): 5 mg via ORAL
  Filled 2023-11-24 (×6): qty 1

## 2023-11-24 MED ORDER — LORAZEPAM 2 MG/ML IJ SOLN: 2.0000 mg | Freq: Three times a day (TID) | INTRAMUSCULAR | Status: DC | PRN

## 2023-11-24 MED ORDER — HALOPERIDOL LACTATE 5 MG/ML IJ SOLN: 5.0000 mg | Freq: Three times a day (TID) | INTRAMUSCULAR | Status: DC | PRN

## 2023-11-24 MED ORDER — HYDROXYZINE HCL 25 MG PO TABS: 25.0000 mg | ORAL_TABLET | Freq: Three times a day (TID) | ORAL | Status: DC | PRN

## 2023-11-24 MED ORDER — MELATONIN 5 MG PO TABS
5.0000 mg | ORAL_TABLET | Freq: Every evening | ORAL | Status: DC | PRN
  Administered 2023-11-24 – 2023-11-28 (×5): 5 mg via ORAL
  Filled 2023-11-24 (×5): qty 1

## 2023-11-24 NOTE — [facility] Counselor (Signed)
 Adult Comprehensive Assessment  Patient ID: Frank Holden, male   DOB: 05-Nov-2005, 18 y.o.   MRN: [id number]  Information Source: Information source: Patient  Current Stressors:  Patient states their primary concerns and needs for treatment are:: The patient stated to make sure he is no longer having racing thoughts, SI and depression. Patient states their goals for this hospitilization and ongoing recovery are:: The patient stated to do better with his health, get outside more and not keep things bottled up. Educational / Learning stressors: None reported. Employment / Job issues: The patient stated that he will be starting new job tomorrow. Family Relationships: None reported. Financial / Lack of resources (include bankruptcy): None reported. Housing / Lack of housing: None repirted. Physical health (include injuries & life threatening diseases): None reported. Social relationships: None reported. Substance abuse: None reported Bereavement / Loss: None reported.  Living/Environment/Situation:  Living Arrangements: Parent, Other relatives Living conditions (as described by patient or guardian): The patient satted fine Who else lives in the home?: The patient stated mom, stepdad, and siblings. How long has patient lived in current situation?: The patient stated since last year. What is atmosphere in current home: Comfortable  Family History:  Marital status: Single Are you sexually active?: No What is your sexual orientation?: The patient stated straight. Does patient have children?: No  Childhood History:  By whom was/is the patient raised?: Grandparents Additional childhood history information: The patient satted that his mom was more present then his father. Description of patient's relationship with caregiver when they were a child: The patient stated that they had a strong relationship. Patient's description of current relationship with people who raised him/her: The patient  stated that his great grandmpther passed and he has a pretty good relationship with his mother. How were you disciplined when you got in trouble as a child/adolescent?: The patient stated normal whoopings and things taken away. Does patient have siblings?: Yes Number of Siblings: 3 Description of patient's current relationship with siblings: The patient stated that its not perfect but good. 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 How Long has Patient Been Employed?: Will start in a few days. Are You Satisfied With Your Job?: Yes Do You Work More Than One Job?: No Work Stressors: None reported Patient's Job has Been Impacted by Current Illness: No What is the Longest Time Patient has Held a Job?: The patient stated 10 months Where was the Patient Employed at that Time?: Yhe patient stated  [employer] Has Patient ever Been in the [employer]?: No  Financial Resources:   Financial resources: Income from employment, Medicaid Does patient have a representative payee or guardian?: No  Alcohol/Substance Abuse:   What has been your use of drugs/alcohol within the last 12 months?: the patient stated none. If attempted suicide, did drugs/alcohol play a role in this?: No Alcohol/Substance Abuse Treatment Hx: Denies past history Has alcohol/substance abuse ever caused legal  problems?: No  Social Support System:  Patient's Community Support System: Production assistant, radio System: the patient they are good with spotten something wrong Type of faith/religion: the patient stated christian How does patient's faith help to cope with current illness?: The patient stated prayer  Leisure/Recreation:   Do You Have Hobbies?: Yes Leisure and Hobbies: The patient stated reading comics and exercise.  Strengths/Needs:   Patient states these barriers may affect/interfere with their treatment: None reported Patient states these barriers may affect their return to the community: None reported Other important information patient would like considered in planning for their treatment: None reported.  Discharge Plan:   Currently receiving community mental health services: No (The patient stated that his therapy ended a couple of months ago, mental health crisi in [place].) Patient states concerns and preferences for aftercare planning are: The patient stated that he dont know yet. Patient states they will know when they are safe and ready for discharge when: The patient stated that he feel good about himself. Does patient have access to transportation?: Yes Does patient have financial barriers related to discharge medications?: No Patient description of barriers related to discharge medications: None reported. Will patient be returning to same living situation after discharge?: Yes  Summary/Recommendations:     The patient is a 18 year old male patient from [place] [facility]) who presented to [facility] as a voluntary walk-in, accompanied by his mother with complaint of SI, with no plan/intent.  The patient reports that he has been feeling depressed for about 5 years which is when his great-grandmother passed away. Patient stated, my mental health is getting worse. The patient stated that he has been experiencing depression racing thoughts, and SI. Patient  reports that he was in therapy for about 6 months at a mental health crisis unit in [place]. The patient stated he was not sure if he wanted to start therapy again being that he just completed therapy. The patient denies any stress factors. The patient denies any use of drugs or alcohol. The patient stated that he was just hired at [employer] a [employer]. The patient states he receives Medicaid. The patient states he has access to transport and will be returning home. Recommendations include crisis stabilization, therapeutic milieu, encourage group attendance and participation, medication management for detox/mood stabilization and development of comprehensive mental wellness.   Frank Holden. 11/24/2023

## 2023-11-24 NOTE — Group Note (Signed)
 Date:  11/24/2023 Time:  7:36 PM  Group Topic/Focus:  Activity Group: The focus of the group is to promote activity for the patients and encourage them to go outside and get some fresh air and some exercise.    Participation Level:  Did Not Attend   Camellia HERO Mahika Vanvoorhis 11/24/2023, 7:36 PM

## 2023-11-24 NOTE — [facility] Suicide Risk Assessment (Signed)
 [facility] Admission Suicide Risk Assessment   Nursing information obtained from:  Patient Demographic factors:  Age 18 or older Current Mental Status:  Suicidal ideation indicated by patient Loss Factors:  Decrease in vocational status Historical Factors:  Impulsivity Risk Reduction Factors:  Positive social support  Total Time spent with patient: 1 hour Principal Problem: MDD (major depressive disorder), recurrent episode, severe (HCC) Diagnosis:  Principal Problem:   MDD (major depressive disorder), recurrent episode, severe (HCC)  Subjective Data: The patient is a 18 year old male with no prior psychiatric medication history, presenting with worsening depressive symptoms, generalized anxiety, racing thoughts, and passive suicidal ideation without plan or intent, in the context of unresolved grief and probable mood instability. His clinical picture is significant for chronic depression, episodic mood elevation, sleep disturbance, and prior suicide attempt.  He demonstrates limited coping skills, poor social engagement, and minimal psychiatric follow-up despite prior engagement in therapy. His physical environment appears stable, and he has recent employment, suggesting some functional capacity. However, his ongoing passive SI and impaired sleep present notable safety concerns. Rule out bipolar disorder. PT started on abilify  in ED will continue. Will also start lexapro  for anxiety.  Continued Clinical Symptoms:    The Alcohol Use Disorders Identification Test, Guidelines for Use in Primary Care, Second Edition.  World Science writer [facility]). Score between 0-7:  no or low risk or alcohol related problems. Score between 8-15:  moderate risk of alcohol related problems. Score between 16-19:  high risk of alcohol related problems. Score 20 or above:  warrants further diagnostic evaluation for alcohol dependence and treatment.   CLINICAL FACTORS:   Severe Anxiety and/or  Agitation Depression:   Anhedonia Hopelessness Insomnia Severe   Musculoskeletal: Strength & Muscle Tone: within normal limits Gait & Station: normal Patient leans: N/A  Psychiatric Specialty Exam:  Presentation  General Appearance:  Casual  Eye Contact: Good  Speech: Clear and Coherent  Speech Volume: Normal  Handedness: Right   Mood and Affect  Mood: Depressed  Affect: Appropriate   Thought Process  Thought Processes: Coherent  Descriptions of Associations:Intact  Orientation:Full (Time, Place and Person)  Thought Content:WDL  History of Schizophrenia/Schizoaffective disorder:No  Duration of Psychotic Symptoms:No data recorded Hallucinations:Hallucinations: None  Ideas of Reference:None  Suicidal Thoughts:Suicidal Thoughts: Yes, Passive SI Passive Intent and/or Plan: Without Intent; Without Plan  Homicidal Thoughts:Homicidal Thoughts: No   Sensorium  Memory: Immediate Fair; Recent Fair; Remote Fair  Judgment: Fair  Insight: Fair   Executive Functions  Concentration: Good  Attention Span: Good  Recall: Good  Fund of Knowledge: Good  Language: Good   Psychomotor Activity  Psychomotor Activity: Psychomotor Activity: Normal   Assets  Assets: Communication Skills; Resilience; Housing; Social Support; Financial Resources/Insurance   Sleep  Sleep: Sleep: Poor Number of Hours of Sleep: 4    Physical Exam: Physical Exam ROS Blood pressure 134/72, pulse 76, temperature 97.7 F (36.5 C), resp. rate 18, SpO2 100%. There is no height or weight on file to calculate BMI.   COGNITIVE FEATURES THAT CONTRIBUTE TO RISK:  None    SUICIDE RISK:   Moderate:  Frequent suicidal ideation with limited intensity, and duration, some specificity in terms of plans, no associated intent, good self-control, limited dysphoria/symptomatology, some risk factors present, and identifiable protective factors, including available and  accessible social support.  PLAN OF CARE:   Safety and Monitoring:   --  Voluntary admission to inpatient psychiatric unit for safety, stabilization and treatment -- Daily contact with patient to assess and  evaluate symptoms and progress in treatment -- Patient's case to be discussed in multi-disciplinary team meeting -- Observation Level : q15 minute checks -- Vital signs:  q12 hours -- Precautions: suicide   I certify that inpatient services furnished can reasonably be expected to improve the patient's condition.   Donnice FORBES Right, PA-C 11/24/2023, 4:53 PM

## 2023-11-24 NOTE — ED Notes (Incomplete)
 Pt presents to [facility] as a voluntary walk-in, accompanied by his mother with complaint of SI, with no plan/intent. Pt reports that he wrote a note earlier today and his sister saw it. He states that note read along the lines of it will all be over soon and I'm sorry. Pt reports having ongoing SI since the passing of his grandmother. Pt reports prior suicide attempt by hanging, but he was unable to remember when that occurre

## 2023-11-24 NOTE — ED Notes (Signed)
 Pt  admitted as voluntary walk-in, accompanied by his mother with complaint of SI, with no plan/intent. Pt reports that he wrote a note earlier today and his sister saw it. He states that note read along the lines of it will all be over soon and I'm sorry. Pt reports having ongoing SI since the passing of his grandmother. Pt reports prior suicide attempt by hanging, but he was unable to remember when that occurred. Pt with sad affect and guarded. Pt denies si/hi/avh.  Cooperative with admission process and skin check. No contraband. Pt verbally contracted to safety. Pt oriented to unit and offered no concerns. Pt made phone call to let Mom know he was okay.  Pt offered meal and fluids with acceptance. Plan is for pt to transfer to [facility] for further treatment. Consent signed. Will continue to monitor and report changes as noted

## 2023-11-24 NOTE — ED Notes (Signed)
 Patient observed/assessed in bed/chair resting quietly appearing in no distress and verbalizing no complaints at this time. Will continue to monitor.

## 2023-11-24 NOTE — Group Note (Signed)
 Date:  11/24/2023 Time:  11:10 AM  Group Topic/Focus:  Self Esteem Action Plan:   The focus of this group is to help patients create a plan to continue to build self-esteem after discharge.    Participation Level:  Did Not Attend   Camellia HERO Ariyanna Oien 11/24/2023, 11:10 AM

## 2023-11-24 NOTE — H&P (Signed)
 Psychiatric Admission Assessment Adult  Patient Identification: Frank Holden MRN:  [id number] Date of Evaluation:  11/24/2023 Chief Complaint:  MDD (major depressive disorder), recurrent episode, severe (HCC) [F33.2]   History of Present Illness:   The patient is a 18 year old single male from [place], [place] , who presented voluntarily to [facility] accompanied by his mother due to worsening depression, suicidal ideation without current plan or intent, and racing thoughts. He reports experiencing depressive symptoms for the past five years, which he attributes to the death of his great-grandmother. He describes his mental health as "getting worse" and endorses daily feelings of sadness, hopelessness, worthlessness, irritability, anhedonia, crying spells, and self-isolation. He reports significant difficulty sleeping, staying awake until 4-5 AM due to racing thoughts. He also endorses episodes of increased energy and mood lability a few times per week, followed by depression and anxiety.  The patient reports a prior suicide attempt approximately one year ago, involving an attempted hanging with a belt; he states he did not disclose this event to anyone at the time. Also reports more recent attempt but is guarded and does not further discuss. He denies current plan or intent but acknowledges ongoing suicidal thoughts.  The patient reports a prior history of therapy for approximately six months at a local crisis center but felt it was only somewhat helpful. He has no history of psychiatric medication use and denies current engagement with any outpatient mental health provider. He reports he recently ceased marijuana use after previously using intermittently but denies any other illicit substance use or current alcohol use.  He resides with his mother, stepfather, and siblings in a comfortable home environment and describes his relationships with family as generally positive. He reports that he was  primarily raised by his grandparents during childhood. He is not sexually active, has no children, and denies any history of abuse, neglect, or trauma.  The patient reports starting a new job at [employer]) in the next few days and denies work-related stress at this time. He enjoys reading comics and exercising as hobbies. He identifies as [place] and uses prayer as a coping mechanism.  At presentation, the patient is visibly anxious, speaking softly and hesitantly, with leg shaking noted throughout the assessment. His affect is anxious and mood depressed, but he remains cooperative and attentive. No psychosis, paranoia, or disorganized thinking observed during evaluation.His anxiety prevents him from speaking at times but he is cooperative. Struggles in group environments.   Total Time spent with patient: 1 hour Sleep  Sleep:Sleep: Poor Number of Hours of Sleep: 4  Past Psychiatric History:   Psychiatric History:  Information collected from pt  Prev Dx/Sx: none Current Psych Provider: none Home Meds (current): none Previous Med Trials: none Therapy: prior  Prior Psych Hospitalization: none  Prior Self Harm: See above Prior Violence: none  Family Psych History: none reported Family Hx suicide: none  Social History:  Developmental Hx: none reported Educational Hx: high school graduate Occupational Hx: temp Legal Hx: none Living Situation: with family  Spiritual Hx: chirstian Access to weapons/lethal means: none   Substance History Alcohol: denie   Illicit drugs: denies Prescription drug abuse: none Rehab hx: none Is the patient at risk to self? Yes.    Has the patient been a risk to self in the past 6 months? Yes.    Has the patient been a risk to self within the distant past? Yes.    Is the patient a risk to others? No.  Has the  patient been a risk to others in the past 6 months? No.  Has the patient been a risk to others within the distant past? No.    [name] Scale:  Flowsheet Row Admission (Current) from 11/24/2023 in [facility] INPATIENT BEH[facility] ED from 11/23/2023 in [facility] ED to Hosp-Admission (Discharged) from 12/04/2020 in [facility]  C-SSRS RISK CATEGORY Low Risk Low Risk No Risk     Past Medical History: History reviewed. No pertinent past medical history.  Past Surgical History:  Procedure Laterality Date   LAPAROSCOPIC APPENDECTOMY N/A 12/05/2020   Procedure: APPENDECTOMY LAPAROSCOPIC;  Surgeon: Sebastian Moles, MD;  Location: [facility] OR;  Service: General;  Laterality: N/A;   Family History: History reviewed. No pertinent family history.  Social History:  Social History   Substance and Sexual Activity  Alcohol Use No     Social History   Substance and Sexual Activity  Drug Use No      Allergies:  No Known Allergies Lab Results:  Results for orders placed or performed during the hospital encounter of 11/23/23 (from the past 48 hours)  CBC with Differential/Platelet     Status: None   Collection Time: 11/23/23 10:38 PM  Result Value Ref Range   WBC 5.8 4.0 - 10.5 K/uL   RBC 5.00 4.22 - 5.81 MIL/uL   Hemoglobin 13.6 13.0 - 17.0 g/dL   HCT 58.9 60.9 - 47.9 %   MCV 82.0 80.0 - 100.0 fL   MCH 27.2 26.0 - 34.0 pg   MCHC 33.2 30.0 - 36.0 g/dL   RDW 86.9 88.4 - 84.4 %   Platelets 227 150 - 400 K/uL   nRBC 0.0 0.0 - 0.2 %   Neutrophils Relative % 69 %   Neutro Abs 4.0 1.7 - 7.7 K/uL   Lymphocytes Relative 20 %   Lymphs Abs 1.1 0.7 - 4.0 K/uL   Monocytes Relative 7 %   Monocytes Absolute 0.4 0.1 - 1.0 K/uL   Eosinophils Relative 4 %   Eosinophils Absolute 0.2 0.0 - 0.5 K/uL   Basophils Relative 0 %   Basophils Absolute 0.0 0.0 - 0.1 K/uL   Immature Granulocytes 0 %   Abs Immature Granulocytes 0.02 0.00 - 0.07 K/uL    Comment: Performed at [facility] Lab, [address], [place], [place] [address]  Comprehensive metabolic panel     Status:  Abnormal   Collection Time: 11/23/23 10:38 PM  Result Value Ref Range   Sodium 136 135 - 145 mmol/L   Potassium 4.0 3.5 - 5.1 mmol/L   Chloride 102 98 - 111 mmol/L   CO2 24 22 - 32 mmol/L   Glucose, Bld 140 (H) 70 - 99 mg/dL    Comment: Glucose reference range applies only to samples taken after fasting for at least 8 hours.   BUN 8 6 - 20 mg/dL   Creatinine, Ser 8.87 0.61 - 1.24 mg/dL   Calcium 9.5 8.9 - 89.6 mg/dL   Total Protein 7.2 6.5 - 8.1 g/dL   Albumin  4.3 3.5 - 5.0 g/dL   AST 25 15 - 41 U/L   ALT 16 0 - 44 U/L   Alkaline Phosphatase 74 38 - 126 U/L   Total Bilirubin 1.0 0.0 - 1.2 mg/dL   GFR, Estimated >39 >39 mL/min    Comment: (NOTE) Calculated using the CKD-EPI Creatinine Equation (2021)    Anion gap 10 5 - 15    Comment: Performed at [facility], [address] [place], [place] [address]  Magnesium      Status: None   Collection Time: 11/23/23 10:38 PM  Result Value Ref Range   Magnesium  2.1 1.7 - 2.4 mg/dL    Comment: Performed at [facility], [address] [place], [place] [address]  Ethanol     Status: None   Collection Time: 11/23/23 10:38 PM  Result Value Ref Range   Alcohol, Ethyl (B) <15 <15 mg/dL    Comment: (NOTE) For medical purposes only. Performed at [facility], [address], [place], [place] [address]   POCT Urine Drug Screen - (I-Screen)     Status: None   Collection Time: 11/23/23 10:42 PM  Result Value Ref Range   POC Amphetamine UR None Detected NONE DETECTED (Cut Off Level 1000 ng/mL)   POC Secobarbital (BAR) None Detected NONE DETECTED (Cut Off Level 300 ng/mL)   POC Buprenorphine (BUP) None Detected NONE DETECTED (Cut Off Level 10 ng/mL)   POC Oxazepam (BZO) None Detected NONE DETECTED (Cut Off Level 300 ng/mL)   POC Cocaine UR None Detected NONE DETECTED (Cut Off Level 300 ng/mL)   POC Methamphetamine UR None Detected NONE DETECTED (Cut Off Level 1000 ng/mL)   POC Morphine  None Detected NONE DETECTED (Cut Off  Level 300 ng/mL)   POC Methadone UR None Detected NONE DETECTED (Cut Off Level 300 ng/mL)   POC Oxycodone  UR None Detected NONE DETECTED (Cut Off Level 100 ng/mL)   POC Marijuana UR None Detected NONE DETECTED (Cut Off Level 50 ng/mL)    Blood Alcohol level:  Lab Results  Component Value Date   [facility] <15 11/23/2023    Metabolic Disorder Labs:  No results found for: HGBA1C, MPG No results found for: PROLACTIN No results found for: CHOL, TRIG, HDL, CHOLHDL, VLDL, LDLCALC  Current Medications: Current Facility-Administered Medications  Medication Dose Route Frequency Provider Last Rate Last Admin   acetaminophen  (TYLENOL ) tablet 650 mg  650 mg Oral Q6H PRN Bobbitt, Shalon E, NP       alum & mag hydroxide-simeth (MAALOX/MYLANTA) 200-200-20 MG/5ML suspension 30 mL  30 mL Oral Q4H PRN Bobbitt, Shalon E, NP       ARIPiprazole  (ABILIFY ) tablet 5 mg  5 mg Oral Daily Bobbitt, Shalon E, NP   5 mg at 11/24/23 0847   haloperidol  (HALDOL ) tablet 5 mg  5 mg Oral TID PRN Bobbitt, Shalon E, NP       And   diphenhydrAMINE  (BENADRYL ) capsule 50 mg  50 mg Oral TID PRN Bobbitt, Shalon E, NP       haloperidol  lactate (HALDOL ) injection 5 mg  5 mg Intramuscular TID PRN Bobbitt, Shalon E, NP       And   diphenhydrAMINE  (BENADRYL ) injection 50 mg  50 mg Intramuscular TID PRN Bobbitt, Shalon E, NP       And   LORazepam  (ATIVAN ) injection 2 mg  2 mg Intramuscular TID PRN Bobbitt, Shalon E, NP       haloperidol  lactate (HALDOL ) injection 10 mg  10 mg Intramuscular TID PRN Bobbitt, Shalon E, NP       And   diphenhydrAMINE  (BENADRYL ) injection 50 mg  50 mg Intramuscular TID PRN Bobbitt, Shalon E, NP       And   LORazepam  (ATIVAN ) injection 2 mg  2 mg Intramuscular TID PRN Bobbitt, Shalon E, NP       hydrOXYzine  (ATARAX ) tablet 25 mg  25 mg Oral TID  PRN Bobbitt, Shalon E, NP       magnesium  hydroxide (MILK OF MAGNESIA) suspension 30 mL  30 mL Oral Daily PRN Bobbitt, Shalon E, NP        melatonin tablet 5 mg  5 mg Oral QHS PRN Bobbitt, Shalon E, NP       PTA Medications: Medications Prior to Admission  Medication Sig Dispense Refill Last Dose/Taking   acetaminophen  (TYLENOL ) 500 MG tablet Take 2 tablets (1,000 mg total) by mouth every 6 (six) hours as needed.      loratadine  (CLARITIN ) 10 MG tablet Take 1 tablet (10 mg total) by mouth daily. 30 tablet 11    triamcinolone  ointment (KENALOG ) 0.5 % Apply 1 Application topically 2 (two) times daily. For moderate to severe eczema.  Do not use for more than 1 week at a time. 60 g 0     Psychiatric Specialty Exam:  Presentation  General Appearance:  Casual  Eye Contact: Good  Speech: Clear and Coherent  Speech Volume: Normal    Mood and Affect  Mood: Depressed  Affect: Appropriate   Thought Process  Thought Processes: Coherent  Descriptions of Associations:Intact  Orientation:Full (Time, Place and Person)  Thought Content:WDL  Hallucinations:Hallucinations: None  Ideas of Reference:None  Suicidal Thoughts:Suicidal Thoughts: Yes, Passive SI Passive Intent and/or Plan: Without Intent; Without Plan  Homicidal Thoughts:Homicidal Thoughts: No   Sensorium  Memory: Immediate Fair; Recent Fair; Remote Fair  Judgment: Fair  Insight: Fair   Executive Functions  Concentration: Good  Attention Span: Good  Recall: Good  Fund of Knowledge: Good  Language: Good   Psychomotor Activity  Psychomotor Activity: Psychomotor Activity: Normal   Assets  Assets: Communication Skills; Resilience; Housing; Social Support; Financial Resources/Insurance    Musculoskeletal: Strength & Muscle Tone: within normal limits Gait & Station: normal  Physical Exam: Physical Exam Vitals and nursing note reviewed.  HENT:     Head: Atraumatic.  Eyes:     Extraocular Movements: Extraocular movements intact.  Pulmonary:     Effort: Pulmonary effort is normal.  Neurological:     Mental Status:  He is alert and oriented to person, place, and time.    Review of Systems  Psychiatric/Behavioral:  Positive for depression and suicidal ideas. Negative for substance abuse. The patient is nervous/anxious and has insomnia.    Blood pressure 118/73, pulse 81, temperature 98.1 F (36.7 C), resp. rate 18, SpO2 98%. There is no height or weight on file to calculate BMI.  Principal Diagnosis: MDD (major depressive disorder), recurrent episode, severe (HCC) Diagnosis:  Principal Problem:   MDD (major depressive disorder), recurrent episode, severe (HCC)   Clinical Decision Making:  Treatment Plan Summary:  The patient is a 18 year old male with no prior psychiatric medication history, presenting with worsening depressive symptoms, generalized anxiety, racing thoughts, and passive suicidal ideation without plan or intent, in the context of unresolved grief and probable mood instability. His clinical picture is significant for chronic depression, episodic mood elevation, sleep disturbance, and prior suicide attempt.  He demonstrates limited coping skills, poor social engagement, and minimal psychiatric follow-up despite prior engagement in therapy. His physical environment appears stable, and he has recent employment, suggesting some functional capacity. However, his ongoing passive SI and impaired sleep present notable safety concerns. Rule out bipolar disorder. PT started on abilify  in ED will continue. Will also start lexapro  for anxiety.  Safety and Monitoring:             -- Voluntary admission to inpatient  psychiatric unit for safety, stabilization and treatment             -- Daily contact with patient to assess and evaluate symptoms and progress in treatment             -- Patient's case to be discussed in multi-disciplinary team meeting             -- Observation Level: q15 minute checks             -- Vital signs:  q12 hours             -- Precautions: suicide, elopement, and assault    2. Psychiatric Diagnoses and Treatment:              Unspecified mood disorder  -- Continue abilify  5 mg daily  -- Start lexapro  10 mg daily      -- The risks/benefits/side-effects/alternatives to this medication were discussed in detail with the patient and time was given for questions. The patient consents to medication trial.                -- Metabolic profile and EKG monitoring obtained while on an atypical antipsychotic (BMI: Lipid Panel: HbgA1c: QTc:)              -- Encouraged patient to participate in unit milieu and in scheduled group therapies                            3. Medical Issues Being Addressed:     no acute concerns.  4. Discharge Planning:              -- Social work and case management to assist with discharge planning and identification of hospital follow-up needs prior to discharge             -- Estimated LOS: 5-7 days             -- Discharge Concerns: Need to establish a safety plan; Medication compliance and effectiveness             -- Discharge Goals: Return home with outpatient referrals follow ups  Physician Treatment Plan for Primary Diagnosis: MDD (major depressive disorder), recurrent episode, severe (HCC) Long Term Goal(s): Improvement in symptoms so as ready for discharge  Short Term Goals: Ability to verbalize feelings will improve, Ability to disclose and discuss suicidal ideas, Ability to identify and develop effective coping behaviors will improve, Ability to maintain clinical measurements within normal limits will improve, Compliance with prescribed medications will improve, and Ability to identify triggers associated with substance abuse/mental health issues will improve   I certify that inpatient services furnished can reasonably be expected to improve the patient's condition.    Donnice FORBES Right, PA-C 7/20/20251:46 PM

## 2023-11-24 NOTE — Progress Notes (Signed)
   11/24/23 0900  Psych Admission Type (Psych Patients Only)  Admission Status Voluntary  Psychosocial Assessment  Patient Complaints None  Eye Contact Fair  Facial Expression Worried  Affect Preoccupied (patient worried about losing his new temporary job.)  Systems analyst;Soft  Interaction Cautious  Motor Activity Slow  Appearance/Hygiene Unremarkable  Behavior Characteristics Cooperative;Appropriate to situation  Mood Preoccupied;Pleasant  Aggressive Behavior  Effect No apparent injury  Thought Process  Coherency WDL  Content WDL  Delusions None reported or observed  Perception WDL  Hallucination None reported or observed  Judgment WDL  Confusion None  Danger to Self  Current suicidal ideation? Denies  Self-Injurious Behavior No self-injurious ideation or behavior indicators observed or expressed   Agreement Not to Harm Self Yes  Description of Agreement Verbal  Danger to Others  Danger to Others None reported or observed   Patient stated that he is feeling better than yesterday. Patient reports that he has not had any racing thoughts so far and that I feel great about myself.

## 2023-11-24 NOTE — BH IP Treatment Plan (Signed)
 Interdisciplinary Treatment and Diagnostic Plan Update  11/24/2023 Time of Session: 10:26 AM  Frank Holden MRN: [id number]  Principal Diagnosis: MDD (major depressive disorder), recurrent episode, severe (HCC)  Secondary Diagnoses: Principal Problem:   MDD (major depressive disorder), recurrent episode, severe (HCC)   Current Medications:  Current Facility-Administered Medications  Medication Dose Route Frequency Provider Last Rate Last Admin   acetaminophen  (TYLENOL ) tablet 650 mg  650 mg Oral Q6H PRN Bobbitt, Shalon E, NP       alum & mag hydroxide-simeth (MAALOX/MYLANTA) 200-200-20 MG/5ML suspension 30 mL  30 mL Oral Q4H PRN Bobbitt, Shalon E, NP       ARIPiprazole  (ABILIFY ) tablet 5 mg  5 mg Oral Daily Bobbitt, Shalon E, NP   5 mg at 11/24/23 0847   haloperidol  (HALDOL ) tablet 5 mg  5 mg Oral TID PRN Bobbitt, Shalon E, NP       And   diphenhydrAMINE  (BENADRYL ) capsule 50 mg  50 mg Oral TID PRN Bobbitt, Shalon E, NP       haloperidol  lactate (HALDOL ) injection 5 mg  5 mg Intramuscular TID PRN Bobbitt, Shalon E, NP       And   diphenhydrAMINE  (BENADRYL ) injection 50 mg  50 mg Intramuscular TID PRN Bobbitt, Shalon E, NP       And   LORazepam  (ATIVAN ) injection 2 mg  2 mg Intramuscular TID PRN Bobbitt, Shalon E, NP       haloperidol  lactate (HALDOL ) injection 10 mg  10 mg Intramuscular TID PRN Bobbitt, Shalon E, NP       And   diphenhydrAMINE  (BENADRYL ) injection 50 mg  50 mg Intramuscular TID PRN Bobbitt, Shalon E, NP       And   LORazepam  (ATIVAN ) injection 2 mg  2 mg Intramuscular TID PRN Bobbitt, Shalon E, NP       hydrOXYzine  (ATARAX ) tablet 25 mg  25 mg Oral TID PRN Bobbitt, Shalon E, NP       magnesium  hydroxide (MILK OF MAGNESIA) suspension 30 mL  30 mL Oral Daily PRN Bobbitt, Shalon E, NP       melatonin tablet 5 mg  5 mg Oral QHS PRN Bobbitt, Shalon E, NP       PTA Medications: Medications Prior to Admission  Medication Sig Dispense Refill Last Dose/Taking    acetaminophen  (TYLENOL ) 500 MG tablet Take 2 tablets (1,000 mg total) by mouth every 6 (six) hours as needed.      loratadine  (CLARITIN ) 10 MG tablet Take 1 tablet (10 mg total) by mouth daily. 30 tablet 11    triamcinolone  ointment (KENALOG ) 0.5 % Apply 1 Application topically 2 (two) times daily. For moderate to severe eczema.  Do not use for more than 1 week at a time. 60 g 0     Patient Stressors:    Patient Strengths:    Treatment Modalities: Medication Management, Group therapy, Case management,  1 to 1 session with clinician, Psychoeducation, Recreational therapy.   Physician Treatment Plan for Primary Diagnosis: MDD (major depressive disorder), recurrent episode, severe (HCC) Long Term Goal(s):     Short Term Goals:    Medication Management: Evaluate patient's response, side effects, and tolerance of medication regimen.  Therapeutic Interventions: 1 to 1 sessions, Unit Group sessions and Medication administration.  Evaluation of Outcomes: Not Progressing  Physician Treatment Plan for Secondary Diagnosis: Principal Problem:   MDD (major depressive disorder), recurrent episode, severe (HCC)  Long Term Goal(s):     Short Term Goals:  Medication Management: Evaluate patient's response, side effects, and tolerance of medication regimen.  Therapeutic Interventions: 1 to 1 sessions, Unit Group sessions and Medication administration.  Evaluation of Outcomes: Not Progressing   RN Treatment Plan for Primary Diagnosis: MDD (major depressive disorder), recurrent episode, severe (HCC) Long Term Goal(s): Knowledge of disease and therapeutic regimen to maintain health will improve  Short Term Goals: Ability to remain free from injury will improve, Ability to verbalize frustration and anger appropriately will improve, Ability to demonstrate self-control, Ability to participate in decision making will improve, Ability to verbalize feelings will improve, Ability to disclose and  discuss suicidal ideas, Ability to identify and develop effective coping behaviors will improve, and Compliance with prescribed medications will improve  Medication Management: RN will administer medications as ordered by provider, will assess and evaluate patient's response and provide education to patient for prescribed medication. RN will report any adverse and/or side effects to prescribing provider.  Therapeutic Interventions: 1 on 1 counseling sessions, Psychoeducation, Medication administration, Evaluate responses to treatment, Monitor vital signs and CBGs as ordered, Perform/monitor CIWA, COWS, AIMS and Fall Risk screenings as ordered, Perform wound care treatments as ordered.  Evaluation of Outcomes: Not Progressing   LCSW Treatment Plan for Primary Diagnosis: MDD (major depressive disorder), recurrent episode, severe (HCC) Long Term Goal(s): Safe transition to appropriate next level of care at discharge, Engage patient in therapeutic group addressing interpersonal concerns.  Short Term Goals: Engage patient in aftercare planning with referrals and resources, Increase social support, Increase ability to appropriately verbalize feelings, Increase emotional regulation, Facilitate acceptance of mental health diagnosis and concerns, Facilitate patient progression through stages of change regarding substance use diagnoses and concerns, Identify triggers associated with mental health/substance abuse issues, and Increase skills for wellness and recovery  Therapeutic Interventions: Assess for all discharge needs, 1 to 1 time with Social worker, Explore available resources and support systems, Assess for adequacy in community support network, Educate family and significant other(s) on suicide prevention, Complete Psychosocial Assessment, Interpersonal group therapy.  Evaluation of Outcomes: Not Progressing   Progress in Treatment: Attending groups: Yes. and No. Participating in groups: Yes. and  No. Taking medication as prescribed: Yes. Toleration medication: Yes. Family/Significant other contact made: No, will contact:  CSW will contact if given permission  Patient understands diagnosis: Yes. Discussing patient identified problems/goals with staff: No. Medical problems stabilized or resolved: Yes. Denies suicidal/homicidal ideation: Yes. Issues/concerns per patient self-inventory: No. Other: None   New problem(s) identified: No, Describe:  None identified   New Short Term/Long Term Goal(s): elimination of symptoms of psychosis, medication management for mood stabilization; elimination of SI thoughts; development of comprehensive mental wellness plan.   Patient Goals:  Pt does not report a goal, pt remained silent throughout tx team.   Discharge Plan or Barriers: CSW will assist with appropriate discharge planning   Reason for Continuation of Hospitalization: Depression Medication stabilization  Estimated Length of Stay: 1 to 7 days  Last 3 [name] Suicide Severity Risk Score: Flowsheet Row Admission (Current) from 11/24/2023 in [facility] ED from 11/23/2023 in [facility] ED to Hosp-Admission (Discharged) from 12/04/2020 in [facility] P[facility]RICS  C-SSRS RISK CATEGORY Low Risk Low Risk No Risk    Last PHQ 2/9 Scores:    09/05/2023    2:30 PM 07/27/2022    3:50 PM 12/15/2018   11:15 AM  Depression screen PHQ 2/9  Decreased Interest 0 2 2  Down, Depressed, Hopeless 0 3 2  PHQ - 2 Score 0 5 4  Altered sleeping 0 2 1  Tired, decreased energy 0 2 1  Change in appetite 0 0 0  Feeling bad or failure about yourself  0 1 1  Trouble concentrating 0 0 0  Moving slowly or fidgety/restless 0  0  Suicidal thoughts 0  0  PHQ-9 Score 0 10 7  Difficult doing work/chores Not difficult at all  Somewhat difficult    Scribe for Treatment Team: Lum JONETTA Croft, ISRAEL 11/24/2023 10:58 AM

## 2023-11-24 NOTE — Plan of Care (Signed)

## 2023-11-24 NOTE — [facility] Suicide Risk Assessment (Signed)
 [facility] INPATIENT:  Family/Significant Other Suicide Prevention Education  Suicide Prevention Education:  Contact Attempts: Niki Leventhal, mom, [phone] , has been identified by the patient as the family member/significant other with whom the patient will be residing, and identified as the person(s) who will aid the patient in the event of a mental health crisis.  With written consent from the patient, two attempts were made to provide suicide prevention education, prior to and/or following the patient's discharge.  We were unsuccessful in providing suicide prevention education.  A suicide education pamphlet was given to the patient to share with family/significant other.  Date and time of first attempt: 11/24/23 at 4: 27 pm Date and time of second attempt: attempt needed  Roselyn GORMAN Lento 11/24/2023, 4:26 PM

## 2023-11-25 NOTE — Plan of Care (Signed)
  Problem: Education: Goal: Mental status will improve Outcome: Progressing   Problem: Education: Goal: Knowledge of [facility] Education information/materials will improve Outcome: Progressing   Problem: Education: Goal: Emotional status will improve Outcome: Progressing

## 2023-11-25 NOTE — Group Note (Signed)
 Date:  11/25/2023 Time:  11:35 AM  Group Topic/Focus:  Goals Group:   The focus of this group is to help patients establish daily goals to achieve during treatment and discuss how the patient can incorporate goal setting into their daily lives to aide in recovery.  Participation Level:  Active  Participation Quality:  Appropriate  Affect:  Appropriate  Cognitive:  Appropriate  Insight: Appropriate  Engagement in Group:  Engaged  Modes of Intervention:  Discussion and Education  Additional Comments:    [name] A Frank Holden 11/25/2023, 11:35 AM

## 2023-11-25 NOTE — Progress Notes (Signed)
 Frank Holden is a 18 y.o. male patient. No diagnosis found. History reviewed. No pertinent past medical history. Current Facility-Administered Medications  Medication Dose Route Frequency Provider Last Rate Last Admin   acetaminophen  (TYLENOL ) tablet 650 mg  650 mg Oral Q6H PRN Bobbitt, Shalon E, NP       alum & mag hydroxide-simeth (MAALOX/MYLANTA) 200-200-20 MG/5ML suspension 30 mL  30 mL Oral Q4H PRN Bobbitt, Shalon E, NP       ARIPiprazole  (ABILIFY ) tablet 5 mg  5 mg Oral Daily Bobbitt, Shalon E, NP   5 mg at 11/25/23 9071   haloperidol  (HALDOL ) tablet 5 mg  5 mg Oral TID PRN Bobbitt, Shalon E, NP       And   diphenhydrAMINE  (BENADRYL ) capsule 50 mg  50 mg Oral TID PRN Bobbitt, Shalon E, NP       haloperidol  lactate (HALDOL ) injection 5 mg  5 mg Intramuscular TID PRN Bobbitt, Shalon E, NP       And   diphenhydrAMINE  (BENADRYL ) injection 50 mg  50 mg Intramuscular TID PRN Bobbitt, Shalon E, NP       And   LORazepam  (ATIVAN ) injection 2 mg  2 mg Intramuscular TID PRN Bobbitt, Shalon E, NP       haloperidol  lactate (HALDOL ) injection 10 mg  10 mg Intramuscular TID PRN Bobbitt, Shalon E, NP       And   diphenhydrAMINE  (BENADRYL ) injection 50 mg  50 mg Intramuscular TID PRN Bobbitt, Shalon E, NP       And   LORazepam  (ATIVAN ) injection 2 mg  2 mg Intramuscular TID PRN Bobbitt, Shalon E, NP       escitalopram  (LEXAPRO ) tablet 10 mg  10 mg Oral Daily Millington, Matthew E, PA-C   10 mg at 11/25/23 9071   hydrOXYzine  (ATARAX ) tablet 25 mg  25 mg Oral TID PRN Bobbitt, Shalon E, NP       magnesium  hydroxide (MILK OF MAGNESIA) suspension 30 mL  30 mL Oral Daily PRN Bobbitt, Shalon E, NP       melatonin tablet 5 mg  5 mg Oral QHS PRN Bobbitt, Shalon E, NP   5 mg at 11/25/23 2151   No Known Allergies Principal Problem:   MDD (major depressive disorder), recurrent episode, severe (HCC)  Blood pressure 128/79, pulse 77, temperature (!) 97.4 F (36.3 C), resp. rate 18, SpO2  98%.  Subjective Objective: Vital signs: (most recent): Blood pressure 128/79, pulse 77, temperature (!) 97.4 F (36.3 C), resp. rate 18, SpO2 98%.    Assessment & Plan Patient did not attend group tonight/shift, Patient was cooperative during assessments. Patient denied SI/HI and anxiety upon assessment.  Patient verbalized contract for safety. Patient was encouraged to attend group to learn about various coping mechanism and life skills as he work towards discharged. Pt. verbalized he will attend group tomorrow.    [place]ila Rommel B Ashanna Heinsohn 11/25/2023

## 2023-11-25 NOTE — Progress Notes (Signed)
   11/25/23 1152  Psych Admission Type (Psych Patients Only)  Admission Status Voluntary  Psychosocial Assessment  Patient Complaints None  Eye Contact Fair  Facial Expression Animated;Anxious  Affect Anxious  Speech Logical/coherent  Interaction Assertive  Motor Activity Slow  Appearance/Hygiene Unremarkable  Behavior Characteristics Cooperative  Mood Pleasant  Aggressive Behavior  Effect No apparent injury  Thought Process  Coherency WDL  Content WDL  Delusions None reported or observed  Perception WDL  Hallucination None reported or observed  Judgment WDL  Confusion None  Danger to Self  Current suicidal ideation? Denies  Self-Injurious Behavior No self-injurious ideation or behavior indicators observed or expressed   Agreement Not to Harm Self No  Description of Agreement verbal  Danger to Others  Danger to Others None reported or observed

## 2023-11-25 NOTE — Group Note (Signed)
 Recreation Therapy Group Note   Group Topic:Goal Setting  Group Date: 11/25/2023 Start Time: 1400 End Time: 1500 Facilitators: Celestia Jeoffrey BRAVO, LRT, CTRS Location: Craft Room  Group Description: Product/process development scientist. Patients were given many different magazines, a glue stick, markers, and a piece of cardstock paper. LRT and pts discussed the importance of having goals in life. LRT and pts discussed the difference between short-term and long-term goals, as well as what a SMART goal is. LRT encouraged pts to create a vision board, with images they picked and then cut out with safety scissors from the magazine, for themselves, that capture their short and long-term goals. LRT encouraged pts to show and explain their vision board to the group.   Goal Area(s) Addressed:  Patient will gain knowledge of short vs. long term goals.  Patient will identify goals for themselves. Patient will practice setting SMART goals.   Affect/Mood: N/A   Participation Level: Did not attend    Clinical Observations/Individualized Feedback: Patient did not attend group.   Plan: Continue to engage patient in RT group sessions 2-3x/week.   Jeoffrey BRAVO Celestia, LRT, CTRS 11/25/2023 5:30 PM

## 2023-11-25 NOTE — Group Note (Signed)
 [facility] LCSW Group Therapy Note    Group Date: 11/25/2023 Start Time: 1300 End Time: 1400  Type of Therapy and Topic:  Group Therapy:  Overcoming Obstacles  Participation Level:  [facility] PARTICIPATION LEVEL: Did Not Attend  Mood:  Description of Group:   In this group patients will be encouraged to explore what they see as obstacles to their own wellness and recovery. They will be guided to discuss their thoughts, feelings, and behaviors related to these obstacles. The group will process together ways to cope with barriers, with attention given to specific choices patients can make. Each patient will be challenged to identify changes they are motivated to make in order to overcome their obstacles. This group will be process-oriented, with patients participating in exploration of their own experiences as well as giving and receiving support and challenge from other group members.  Therapeutic Goals: 1. Patient will identify personal and current obstacles as they relate to admission. 2. Patient will identify barriers that currently interfere with their wellness or overcoming obstacles.  3. Patient will identify feelings, thought process and behaviors related to these barriers. 4. Patient will identify two changes they are willing to make to overcome these obstacles:    Summary of Patient Progress   X   Therapeutic Modalities:   Cognitive Behavioral Therapy Solution Focused Therapy Motivational Interviewing Relapse Prevention Therapy   Sherryle JINNY Margo, LCSW

## 2023-11-25 NOTE — Plan of Care (Signed)
   Problem: Education: Goal: Emotional status will improve Outcome: Progressing Goal: Mental status will improve Outcome: Progressing Goal: Verbalization of understanding the information provided will improve Outcome: Progressing   Problem: Activity: Goal: Interest or engagement in activities will improve Outcome: Progressing Goal: Sleeping patterns will improve Outcome: Progressing

## 2023-11-25 NOTE — Plan of Care (Signed)
  Problem: Education: Goal: Emotional status will improve Outcome: Progressing Goal: Mental status will improve Outcome: Progressing Note: Patient reports no longer experiencing racing thoughts Goal: Verbalization of understanding the information provided will improve Outcome: Progressing   Problem: Activity: Goal: Interest or engagement in activities will improve Outcome: Progressing Goal: Sleeping patterns will improve Outcome: Progressing   Problem: Coping: Goal: Ability to verbalize frustrations and anger appropriately will improve Outcome: Progressing Goal: Ability to demonstrate self-control will improve Outcome: Progressing   Problem: Health Behavior/Discharge Planning: Goal: Compliance with treatment plan for underlying cause of condition will improve Outcome: Progressing

## 2023-11-25 NOTE — Progress Notes (Signed)
   11/24/23 2000  Psych Admission Type (Psych Patients Only)  Admission Status Voluntary  Psychosocial Assessment  Patient Complaints None  Eye Contact Brief  Facial Expression Animated;Anxious  Affect Anxious  Speech Logical/coherent  Interaction Assertive  Motor Activity Pacing  Appearance/Hygiene Improved  Behavior Characteristics Cooperative;Appropriate to situation  Mood Preoccupied;Pleasant  Thought Process  Coherency WDL  Content WDL  Delusions WDL  Perception WDL  Hallucination None reported or observed  Judgment WDL  Confusion WDL  Danger to Self  Current suicidal ideation? Denies  Self-Injurious Behavior No self-injurious ideation or behavior indicators observed or expressed   Agreement Not to Harm Self No  Danger to Others  Danger to Others None reported or observed   Patient alert and oriented x 4 no distress noted, he denies SI/HI/AVH,thoughts are organized, he is polite and interacting appropriately with peers and staff. Patient's affect is congruent with mood, appropriate eye contact. 15 minutes safety checks maintained.

## 2023-11-25 NOTE — Progress Notes (Signed)
   11/25/23 2100  Psych Admission Type (Psych Patients Only)  Admission Status Voluntary  Psychosocial Assessment  Patient Complaints None  Eye Contact Fair  Facial Expression Animated;Anxious  Affect Anxious  Speech Logical/coherent  Interaction Assertive  Motor Activity Slow  Appearance/Hygiene Unremarkable  Behavior Characteristics Cooperative;Appropriate to situation  Mood Pleasant  Aggressive Behavior  Effect No apparent injury  Thought Process  Coherency WDL  Content WDL  Delusions None reported or observed  Perception WDL  Hallucination None reported or observed  Judgment WDL  Confusion None  Danger to Self  Current suicidal ideation? Denies  Self-Injurious Behavior No self-injurious ideation or behavior indicators observed or expressed   Agreement Not to Harm Self No  Description of Agreement verbal  Danger to Others  Danger to Others None reported or observed

## 2023-11-25 NOTE — Progress Notes (Signed)
 [facility] MD Progress Note  11/25/2023 4:45 PM Matheau Orona  MRN:  [id number]   Subjective:  Chart reviewed, case discussed in multidisciplinary meeting, patient seen during rounds.    The patient is a 18 year old single male from [place],  , who presented voluntarily to [facility] accompanied by his mother due to worsening depression, suicidal ideation without current plan or intent, and racing thoughts. He reports experiencing depressive symptoms for the past five years, which he attributes to the death of his great-grandmother. He describes his mental health as "getting worse" and endorses daily feelings of sadness, hopelessness, worthlessness, irritability, anhedonia, crying spells, and self-isolation. He reports significant difficulty sleeping, staying awake until 4-5 AM due to racing thoughts. He also endorses episodes of increased energy and mood lability a few times per week, followed by depression and anxiety.   Patient seen today in treatment team meeting and for follow up psychiatric evaluation. Reviewed events of worsening mood and thoughts of self harm leading to this admission. Reports that depression has improved, now rated at 4 out of 10. Reports improved sleep last night. Denies suicidal ideation (SI), homicidal ideation (HI), auditory or visual hallucinations (AVH), or anxiety. Endorses continued benefit from current medication regimen and reports tolerating all medications well without side effects. Medication education was provided during review of newly prescribed medications, and patient verbalized understanding. No new complaints or concerns reported today.   Past Psychiatric History: see h&P Family History: History reviewed. No pertinent family history. Social History:  Social History   Substance and Sexual Activity  Alcohol Use No     Social History   Substance and Sexual Activity  Drug Use No    Social History   Socioeconomic History   Marital status:  Single    Spouse name: Not on file   Number of children: Not on file   Years of education: Not on file   Highest education level: Not on file  Occupational History   Not on file  Tobacco Use   Smoking status: Never   Smokeless tobacco: Never  Substance and Sexual Activity   Alcohol use: No   Drug use: No   Sexual activity: Not on file  Other Topics Concern   Not on file  Social History Narrative   Lives at home with great-grandmother [name], has custody).   His mother lives in [place], does not see him often.   Do not know father.    No pets.          Social Drivers of Corporate investment banker Strain: Not on file  Food Insecurity: No Food Insecurity (11/24/2023)   Hunger Vital Sign    Worried About Running Out of Food in the Last Year: Never true    Ran Out of Food in the Last Year: Never true  Transportation Needs: No Transportation Needs (11/24/2023)   PRAPARE - Administrator, Civil Service (Medical): No    Lack of Transportation (Non-Medical): No  Physical Activity: Not on file  Stress: Not on file  Social Connections: Moderately Isolated (11/24/2023)   Social Connection and Isolation Panel    Frequency of Communication with Friends and Family: Never    Frequency of Social Gatherings with Friends and Family: Never    Attends Religious Services: Never    Database administrator or Organizations: Yes    Attends Banker Meetings: Never    Marital Status: Married   Past Medical History: History reviewed. No pertinent  past medical history.  Past Surgical History:  Procedure Laterality Date   LAPAROSCOPIC APPENDECTOMY N/A 12/05/2020   Procedure: APPENDECTOMY LAPAROSCOPIC;  Surgeon: Sebastian Moles, MD;  Location: [facility] OR;  Service: General;  Laterality: N/A;    Current Medications: Current Facility-Administered Medications  Medication Dose Route Frequency Provider Last Rate Last Admin   acetaminophen  (TYLENOL ) tablet 650 mg  650 mg  Oral Q6H PRN Bobbitt, Shalon E, NP       alum & mag hydroxide-simeth (MAALOX/MYLANTA) 200-200-20 MG/5ML suspension 30 mL  30 mL Oral Q4H PRN Bobbitt, Shalon E, NP       ARIPiprazole  (ABILIFY ) tablet 5 mg  5 mg Oral Daily Bobbitt, Shalon E, NP   5 mg at 11/25/23 9071   haloperidol  (HALDOL ) tablet 5 mg  5 mg Oral TID PRN Bobbitt, Shalon E, NP       And   diphenhydrAMINE  (BENADRYL ) capsule 50 mg  50 mg Oral TID PRN Bobbitt, Shalon E, NP       haloperidol  lactate (HALDOL ) injection 5 mg  5 mg Intramuscular TID PRN Bobbitt, Shalon E, NP       And   diphenhydrAMINE  (BENADRYL ) injection 50 mg  50 mg Intramuscular TID PRN Bobbitt, Shalon E, NP       And   LORazepam  (ATIVAN ) injection 2 mg  2 mg Intramuscular TID PRN Bobbitt, Shalon E, NP       haloperidol  lactate (HALDOL ) injection 10 mg  10 mg Intramuscular TID PRN Bobbitt, Shalon E, NP       And   diphenhydrAMINE  (BENADRYL ) injection 50 mg  50 mg Intramuscular TID PRN Bobbitt, Shalon E, NP       And   LORazepam  (ATIVAN ) injection 2 mg  2 mg Intramuscular TID PRN Bobbitt, Shalon E, NP       escitalopram  (LEXAPRO ) tablet 10 mg  10 mg Oral Daily Millington, Matthew E, PA-C   10 mg at 11/25/23 9071   hydrOXYzine  (ATARAX ) tablet 25 mg  25 mg Oral TID PRN Bobbitt, Shalon E, NP       magnesium  hydroxide (MILK OF MAGNESIA) suspension 30 mL  30 mL Oral Daily PRN Bobbitt, Shalon E, NP       melatonin tablet 5 mg  5 mg Oral QHS PRN Bobbitt, Shalon E, NP   5 mg at 11/24/23 2123    Lab Results:  Results for orders placed or performed during the hospital encounter of 11/23/23 (from the past 48 hours)  CBC with Differential/Platelet     Status: None   Collection Time: 11/23/23 10:38 PM  Result Value Ref Range   WBC 5.8 4.0 - 10.5 K/uL   RBC 5.00 4.22 - 5.81 MIL/uL   Hemoglobin 13.6 13.0 - 17.0 g/dL   HCT 58.9 60.9 - 47.9 %   MCV 82.0 80.0 - 100.0 fL   MCH 27.2 26.0 - 34.0 pg   MCHC 33.2 30.0 - 36.0 g/dL   RDW 86.9 88.4 - 84.4 %   Platelets 227 150 -  400 K/uL   nRBC 0.0 0.0 - 0.2 %   Neutrophils Relative % 69 %   Neutro Abs 4.0 1.7 - 7.7 K/uL   Lymphocytes Relative 20 %   Lymphs Abs 1.1 0.7 - 4.0 K/uL   Monocytes Relative 7 %   Monocytes Absolute 0.4 0.1 - 1.0 K/uL   Eosinophils Relative 4 %   Eosinophils Absolute 0.2 0.0 - 0.5 K/uL   Basophils Relative 0 %   Basophils Absolute 0.0  0.0 - 0.1 K/uL   Immature Granulocytes 0 %   Abs Immature Granulocytes 0.02 0.00 - 0.07 K/uL    Comment: Performed at [facility], [address], [place], [place] [address]  Comprehensive metabolic panel     Status: Abnormal   Collection Time: 11/23/23 10:38 PM  Result Value Ref Range   Sodium 136 135 - 145 mmol/L   Potassium 4.0 3.5 - 5.1 mmol/L   Chloride 102 98 - 111 mmol/L   CO2 24 22 - 32 mmol/L   Glucose, Bld 140 (H) 70 - 99 mg/dL    Comment: Glucose reference range applies only to samples taken after fasting for at least 8 hours.   BUN 8 6 - 20 mg/dL   Creatinine, Ser 8.87 0.61 - 1.24 mg/dL   Calcium 9.5 8.9 - 89.6 mg/dL   Total Protein 7.2 6.5 - 8.1 g/dL   Albumin  4.3 3.5 - 5.0 g/dL   AST 25 15 - 41 U/L   ALT 16 0 - 44 U/L   Alkaline Phosphatase 74 38 - 126 U/L   Total Bilirubin 1.0 0.0 - 1.2 mg/dL   GFR, Estimated >39 >39 mL/min    Comment: (NOTE) Calculated using the CKD-EPI Creatinine Equation (2021)    Anion gap 10 5 - 15    Comment: Performed at [facility], [address] [place], [place] [address]  Magnesium      Status: None   Collection Time: 11/23/23 10:38 PM  Result Value Ref Range   Magnesium  2.1 1.7 - 2.4 mg/dL    Comment: Performed at [facility], [address] [place], [place] [address]  Ethanol     Status: None   Collection Time: 11/23/23 10:38 PM  Result Value Ref Range   Alcohol, Ethyl (B) <15 <15 mg/dL    Comment: (NOTE) For medical purposes only. Performed at [facility], [address], [place], [place] [address]   POCT Urine Drug Screen - (I-Screen)     Status: None    Collection Time: 11/23/23 10:42 PM  Result Value Ref Range   POC Amphetamine UR None Detected NONE DETECTED (Cut Off Level 1000 ng/mL)   POC Secobarbital (BAR) None Detected NONE DETECTED (Cut Off Level 300 ng/mL)   POC Buprenorphine (BUP) None Detected NONE DETECTED (Cut Off Level 10 ng/mL)   POC Oxazepam (BZO) None Detected NONE DETECTED (Cut Off Level 300 ng/mL)   POC Cocaine UR None Detected NONE DETECTED (Cut Off Level 300 ng/mL)   POC Methamphetamine UR None Detected NONE DETECTED (Cut Off Level 1000 ng/mL)   POC Morphine  None Detected NONE DETECTED (Cut Off Level 300 ng/mL)   POC Methadone UR None Detected NONE DETECTED (Cut Off Level 300 ng/mL)   POC Oxycodone  UR None Detected NONE DETECTED (Cut Off Level 100 ng/mL)   POC Marijuana UR None Detected NONE DETECTED (Cut Off Level 50 ng/mL)    Blood Alcohol level:  Lab Results  Component Value Date   [facility] <15 11/23/2023    Metabolic Disorder Labs: No results found for: HGBA1C, MPG No results found for: PROLACTIN No results found for: CHOL, TRIG, HDL, CHOLHDL, VLDL, LDLCALC    Psychiatric Specialty Exam:  Presentation  General Appearance:  Casual  Eye Contact: Good  Speech: Clear and Coherent  Speech Volume: Normal    Mood and Affect  Mood: Depressed  Affect: Appropriate   Thought Process  Thought Processes: Coherent  Descriptions of Associations:Intact  Orientation:Full (Time,  Place and Person)  Thought Content:WDL  Hallucinations:No data recorded Ideas of Reference:None  Suicidal Thoughts:No data recorded Homicidal Thoughts:No data recorded  Sensorium  Memory: Immediate Fair; Recent Fair; Remote Fair  Judgment: Fair  Insight: Fair   Art therapist  Concentration: Good  Attention Span: Good  Recall: Good  Fund of Knowledge: Good  Language: Good   Psychomotor Activity  Psychomotor Activity:No data recorded Musculoskeletal: Strength & Muscle  Tone: within normal limits Gait & Station: normal Assets  Assets: Manufacturing systems engineer; Resilience; Housing; Social Support; Financial Resources/Insurance    Physical Exam: Physical Exam ROS Blood pressure 115/66, pulse 61, temperature 98 F (36.7 C), resp. rate 16, SpO2 99%. There is no height or weight on file to calculate BMI.  Diagnosis: Principal Problem:   MDD (major depressive disorder), recurrent episode, severe (HCC)  The patient is a 18 year old male with no prior psychiatric medication history, presenting with worsening depressive symptoms, generalized anxiety, racing thoughts, and passive suicidal ideation without plan or intent, in the context of unresolved grief and probable mood instability. His clinical picture is significant for chronic depression, episodic mood elevation, sleep disturbance, and prior suicide attempt.  He demonstrates limited coping skills, poor social engagement, and minimal psychiatric follow-up despite prior engagement in therapy. His physical environment appears stable, and he has recent employment, suggesting some functional capacity. However, his ongoing passive SI and impaired sleep present notable safety concerns. Rule out bipolar disorder. PT started on abilify  in ED will continue. Will also start lexapro  for anxiety.   PLAN: Safety and Monitoring:  -- Voluntary admission to inpatient psychiatric unit for safety, stabilization and treatment  -- Daily contact with patient to assess and evaluate symptoms and progress in treatment  -- Patient's case to be discussed in multi-disciplinary team meeting  -- Observation Level : q15 minute checks  -- Vital signs:  q12 hours  -- Precautions: suicide, elopement, and assault -- Encouraged patient to participate in unit milieu and in scheduled group therapies  2. Psychiatric Diagnoses and Treatment:               Unspecified mood disorder             -- Continue abilify  5 mg daily             -- Start  lexapro  10 mg daily      3. Medical Issues Being Addressed:  no acute concerns   4. Discharge Planning:   -- Social work and case management to assist with discharge planning and identification of hospital follow-up needs prior to discharge  -- Estimated LOS: 3-4 days  Hoy CHRISTELLA Pinal, NP 11/25/2023, 4:45 PM

## 2023-11-26 NOTE — Group Note (Signed)
 Date:  11/26/2023 Time:  6:24 PM  Group Topic/Focus:  Making Healthy Choices:   The focus of this group is to help patients identify negative/unhealthy choices they were using prior to admission and identify positive/healthier coping strategies to replace them upon discharge.    Participation Level:  Did Not Attend   Frank Holden 11/26/2023, 6:24 PM

## 2023-11-26 NOTE — Group Note (Signed)
 [facility] LCSW Group Therapy Note   Group Date: 11/26/2023 Start Time: 1300 End Time: 1400  Type of Therapy/Topic:  Group Therapy:  Feelings about Diagnosis  Participation Level:  Did Not Attend    Description of Group:    This group will allow patients to explore their thoughts and feelings about diagnoses they have received. Patients will be guided to explore their level of understanding and acceptance of these diagnoses. Facilitator will encourage patients to process their thoughts and feelings about the reactions of others to their diagnosis, and will guide patients in identifying ways to discuss their diagnosis with significant others in their lives. This group will be process-oriented, with patients participating in exploration of their own experiences as well as giving and receiving support and challenge from other group members.   Therapeutic Goals: 1. Patient will demonstrate understanding of diagnosis as evidence by identifying two or more symptoms of the disorder:  2. Patient will be able to express two feelings regarding the diagnosis 3. Patient will demonstrate ability to communicate their needs through discussion and/or role plays  Summary of Patient Progress: X   Therapeutic Modalities:   Cognitive Behavioral Therapy Brief Therapy Feelings Identification    Nadara JONELLE Fam, LCSW

## 2023-11-26 NOTE — Group Note (Signed)
 Recreation Therapy Group Note   Group Topic:Healthy Support Systems  Group Date: 11/26/2023 Start Time: 1000 End Time: 1050 Facilitators: Celestia Jeoffrey BRAVO, LRT, CTRS Location: Craft Room  Group Description: Straw Bridge. Individually, patients were given 10 plastic drinking straws and an equal length of masking tape. Using the materials provided, patients were instructed to build a free-standing bridge-like structure to suspend an everyday item (ex: puzzle box) off the floor or table surface. All materials were required to be used in Secondary school teacher. LRT facilitated post-activity discussion reviewing how we, humans, are like the structure we built; when things get too heavy in our life and we do not have adequate supports/coping skills, then we will fall just like the straw-built structure will. LRT focused on how having a "base" or structure on the bottom was necessary for the object to stand, meaning we must be secure and stable first before building on ourselves or others. Patients were encouraged to name 2 healthy supports in their life and reflect on how the skills used in this activity can be generalized to daily life post discharge.  Goal Area(s) Addressed:  Patient will identify two healthy support systems in their life. Patient will work on Product manager. Patient will verbalize the importance of having a strong and steady "base".  Patient will follow multi-step directions. Patients will engage in creativity and use all provided materials.   Affect/Mood: Appropriate   Participation Level: Active and Engaged   Participation Quality: Independent   Behavior: Appropriate, Calm, and Cooperative   Speech/Thought Process: Coherent   Insight: Good   Judgement: Good   Modes of Intervention: STEM Activity   Patient Response to Interventions:  Attentive, Engaged, and Receptive   Education Outcome:  Acknowledges education   Clinical Observations/Individualized  Feedback: Frank Holden was active in their participation of session activities and group discussion. Pt identified my dog and music as healthy supports. Pt interacted well with LRT and peers duration of session.    Plan: Continue to engage patient in RT group sessions 2-3x/week.   [address] LRT, CTRS 11/26/2023 1:31 PM

## 2023-11-26 NOTE — Progress Notes (Signed)
 [facility] MD Progress Note  11/26/2023 6:23 PM Frank Holden  MRN:  [id number]   Subjective:  Chart reviewed, case discussed in multidisciplinary meeting, patient seen during rounds.   The patient is a 18 year old single male from [place] , who presented voluntarily to [facility] accompanied by his mother due to worsening depression, suicidal ideation without current plan or intent, and racing thoughts. He reports experiencing depressive symptoms for the past five years, which he attributes to the death of his great-grandmother. He describes his mental health as "getting worse" and endorses daily feelings of sadness, hopelessness, worthlessness, irritability, anhedonia, crying spells, and self-isolation. He reports significant difficulty sleeping, staying awake until 4-5 AM due to racing thoughts. He also endorses episodes of increased energy and mood lability a few times per week, followed by depression and anxiety.   Follow up exam complete, no changes in assessment, reports tolerating medications well, denies SI/HI. Reviewed history reports completed HS 2023, denies learning disability, provides number to contact mom to share presentation and plan of care [name] [phone], he reports groups and keeping my mind occupied as rational for symptom reduction and mood stability. Reviews there is significant conflict in the home that added to his distress and subsequent mood decompensation. Reviewed plan for contnued mental health treatment in the community following discharge. He denies anxiety and depression today, denies all symptoms of psychosis.   07/21: Patient seen today in treatment team meeting and for follow up psychiatric evaluation. Reviewed events of worsening mood and thoughts of self harm leading to this admission. Reports that depression has improved, now rated at 4 out of 10. Reports improved sleep last night. Denies suicidal ideation (SI), homicidal ideation (HI), auditory or  visual hallucinations (AVH), or anxiety. Endorses continued benefit from current medication regimen and reports tolerating all medications well without side effects. Medication education was provided during review of newly prescribed medications, and patient verbalized understanding. No new complaints or concerns reported today.   Past Psychiatric History: see h&P Family History: History reviewed. No pertinent family history. Social History:  Social History   Substance and Sexual Activity  Alcohol Use No     Social History   Substance and Sexual Activity  Drug Use No    Social History   Socioeconomic History   Marital status: Single    Spouse name: Not on file   Number of children: Not on file   Years of education: Not on file   Highest education level: Not on file  Occupational History   Not on file  Tobacco Use   Smoking status: Never   Smokeless tobacco: Never  Substance and Sexual Activity   Alcohol use: No   Drug use: No   Sexual activity: Not on file  Other Topics Concern   Not on file  Social History Narrative   Lives at home with great-grandmother [name], has custody).   His mother lives in [place], does not see him often.   Do not know father.    No pets.          Social Drivers of Corporate investment banker Strain: Not on file  Food Insecurity: No Food Insecurity (11/24/2023)   Hunger Vital Sign    Worried About Running Out of Food in the Last Year: Never true    Ran Out of Food in the Last Year: Never true  Transportation Needs: No Transportation Needs (11/24/2023)   PRAPARE - Administrator, Civil Service (Medical): No  Lack of Transportation (Non-Medical): No  Physical Activity: Not on file  Stress: Not on file  Social Connections: Moderately Isolated (11/24/2023)   Social Connection and Isolation Panel    Frequency of Communication with Friends and Family: Never    Frequency of Social Gatherings with Friends and Family:  Never    Attends Religious Services: Never    Database administrator or Organizations: Yes    Attends Banker Meetings: Never    Marital Status: Married   Past Medical History: History reviewed. No pertinent past medical history.  Past Surgical History:  Procedure Laterality Date   LAPAROSCOPIC APPENDECTOMY N/A 12/05/2020   Procedure: APPENDECTOMY LAPAROSCOPIC;  Surgeon: Sebastian Moles, MD;  Location: [facility] OR;  Service: General;  Laterality: N/A;    Current Medications: Current Facility-Administered Medications  Medication Dose Route Frequency Provider Last Rate Last Admin   acetaminophen  (TYLENOL ) tablet 650 mg  650 mg Oral Q6H PRN Bobbitt, Shalon E, NP       alum & mag hydroxide-simeth (MAALOX/MYLANTA) 200-200-20 MG/5ML suspension 30 mL  30 mL Oral Q4H PRN Bobbitt, Shalon E, NP       ARIPiprazole  (ABILIFY ) tablet 5 mg  5 mg Oral Daily Bobbitt, Shalon E, NP   5 mg at 11/26/23 9180   haloperidol  (HALDOL ) tablet 5 mg  5 mg Oral TID PRN Bobbitt, Shalon E, NP       And   diphenhydrAMINE  (BENADRYL ) capsule 50 mg  50 mg Oral TID PRN Bobbitt, Shalon E, NP       haloperidol  lactate (HALDOL ) injection 5 mg  5 mg Intramuscular TID PRN Bobbitt, Shalon E, NP       And   diphenhydrAMINE  (BENADRYL ) injection 50 mg  50 mg Intramuscular TID PRN Bobbitt, Shalon E, NP       And   LORazepam  (ATIVAN ) injection 2 mg  2 mg Intramuscular TID PRN Bobbitt, Shalon E, NP       haloperidol  lactate (HALDOL ) injection 10 mg  10 mg Intramuscular TID PRN Bobbitt, Shalon E, NP       And   diphenhydrAMINE  (BENADRYL ) injection 50 mg  50 mg Intramuscular TID PRN Bobbitt, Shalon E, NP       And   LORazepam  (ATIVAN ) injection 2 mg  2 mg Intramuscular TID PRN Bobbitt, Shalon E, NP       escitalopram  (LEXAPRO ) tablet 10 mg  10 mg Oral Daily Millington, Matthew E, PA-C   10 mg at 11/26/23 9180   hydrOXYzine  (ATARAX ) tablet 25 mg  25 mg Oral TID PRN Bobbitt, Shalon E, NP       magnesium  hydroxide (MILK OF  MAGNESIA) suspension 30 mL  30 mL Oral Daily PRN Bobbitt, Shalon E, NP       melatonin tablet 5 mg  5 mg Oral QHS PRN Bobbitt, Shalon E, NP   5 mg at 11/25/23 2151    Lab Results:  No results found for this or any previous visit (from the past 48 hours).   Blood Alcohol level:  Lab Results  Component Value Date   [facility] <15 11/23/2023    Metabolic Disorder Labs: No results found for: HGBA1C, MPG No results found for: PROLACTIN No results found for: CHOL, TRIG, HDL, CHOLHDL, VLDL, LDLCALC    Psychiatric Specialty Exam:  Presentation  General Appearance:  Appropriate for Environment  Eye Contact: Good  Speech: Normal Rate  Speech Volume: Normal    Mood and Affect  Mood: Depressed  Affect: Flat  Thought Process  Thought Processes: Goal Directed; Linear  Descriptions of Associations:Intact  Orientation:Full (Time, Place and Person)  Thought Content:Logical  Hallucinations:Hallucinations: None  Ideas of Reference:None  Suicidal Thoughts:Suicidal Thoughts: No  Homicidal Thoughts:Homicidal Thoughts: No   Sensorium  Memory: Immediate Good; Remote Good  Judgment: Fair  Insight: Fair   Art therapist  Concentration: Good  Attention Span: Good  Recall: Good  Fund of Knowledge: Good  Language: Good   Psychomotor Activity  Psychomotor Activity:Psychomotor Activity: Normal  Musculoskeletal: Strength & Muscle Tone: within normal limits Gait & Station: normal Assets  Assets: Housing; Manufacturing systems engineer; Physical Health; Social Support    Physical Exam: Physical Exam ROS Blood pressure 114/73, pulse 89, temperature (!) 97.3 F (36.3 C), resp. rate 18, SpO2 99%. There is no height or weight on file to calculate BMI.  Diagnosis: Principal Problem:   MDD (major depressive disorder), recurrent episode, severe (HCC)  The patient is a 18 year old male with no prior psychiatric medication history,  presenting with worsening depressive symptoms, generalized anxiety, racing thoughts, and passive suicidal ideation without plan or intent, in the context of unresolved grief and probable mood instability. His clinical picture is significant for chronic depression, episodic mood elevation, sleep disturbance, and prior suicide attempt.  He demonstrates limited coping skills, poor social engagement, and minimal psychiatric follow-up despite prior engagement in therapy. His physical environment appears stable, and he has recent employment, suggesting some functional capacity. However, his ongoing passive SI and impaired sleep present notable safety concerns. Rule out bipolar disorder. PT started on abilify  in ED will continue. Will also start lexapro  for anxiety.   PLAN: Safety and Monitoring:  -- Voluntary admission to inpatient psychiatric unit for safety, stabilization and treatment  -- Daily contact with patient to assess and evaluate symptoms and progress in treatment  -- Patient's case to be discussed in multi-disciplinary team meeting  -- Observation Level : q15 minute checks  -- Vital signs:  q12 hours  -- Precautions: suicide, elopement, and assault -- Encouraged patient to participate in unit milieu and in scheduled group therapies  2. Psychiatric Diagnoses and Treatment:               Unspecified mood disorder             -- Continue abilify  5 mg daily             -- Start lexapro  10 mg daily      3. Medical Issues Being Addressed:  no acute concerns   4. Discharge Planning:   -- Social work and case management to assist with discharge planning and identification of hospital follow-up needs prior to discharge  -- Estimated LOS: 3-4 days  Hoy CHRISTELLA Pinal, NP 11/26/2023, 6:23 PM

## 2023-11-26 NOTE — [facility] Suicide Risk Assessment (Signed)
 [facility] INPATIENT:  Family/Significant Other Suicide Prevention Education  Suicide Prevention Education:  Contact Attempts: Britney Wadell/mom [phone]), has been identified by the patient as the family member/significant other with whom the patient will be residing, and identified as the person(s) who will aid the patient in the event of a mental health crisis.  With written consent from the patient, two attempts were made to provide suicide prevention education, prior to and/or following the patient's discharge.  We were unsuccessful in providing suicide prevention education.  A suicide education pamphlet was given to the patient to share with family/significant other.  Date and time of first attempt:11/24/23 at 4: 27 pm  Date and time of second attempt: 11/26/23 at 10:16 AM  CSW received message that call could not be completed as dialed.  Nadara JONELLE Fam 11/26/2023, 10:39 AM

## 2023-11-26 NOTE — Group Note (Signed)
 Date:  11/26/2023 Time:  8:46 PM  Group Topic/Focus:  Wrap-Up Group:   The focus of this group is to help patients review their daily goal of treatment and discuss progress on daily workbooks. We discussed unit rules and expectations along with staff identification such as who the techs are and who the nurses are for the night.    Participation Level:  Active  Participation Quality:  Appropriate  Affect:  Appropriate  Cognitive:  Appropriate  Insight: Appropriate  Engagement in Group:  Engaged  Modes of Intervention:  Discussion  Additional Comments:    Frank Holden 11/26/2023, 8:46 PM

## 2023-11-26 NOTE — Plan of Care (Signed)
  Problem: Education: Goal: Knowledge of [place] [facility] Education information/materials will improve Outcome: Adequate for Discharge Goal: Mental status will improve Outcome: Progressing   Problem: Activity: Goal: Interest or engagement in activities will improve Outcome: Progressing Goal: Sleeping patterns will improve Outcome: Progressing

## 2023-11-26 NOTE — Progress Notes (Signed)
   11/26/23 0800  Psych Admission Type (Psych Patients Only)  Admission Status Voluntary  Psychosocial Assessment  Patient Complaints None  Eye Contact Fair  Facial Expression Animated  Affect Anxious  Speech Logical/coherent  Interaction Assertive  Motor Activity Slow  Appearance/Hygiene Unremarkable  Behavior Characteristics Cooperative  Mood Pleasant  Thought Process  Coherency WDL  Content WDL  Delusions None reported or observed  Perception WDL  Hallucination None reported or observed  Judgment WDL  Confusion None  Danger to Self  Current suicidal ideation? Denies  Self-Injurious Behavior No self-injurious ideation or behavior indicators observed or expressed   Agreement Not to Harm Self No  Description of Agreement verbal  Danger to Others  Danger to Others None reported or observed

## 2023-11-26 NOTE — Group Note (Signed)
 Date:  11/26/2023 Time:  7:04 PM  Group Topic/Focus:  Activity Group: The focus of the group is to promote activity for the patients and to encourage them to go outside to the courtyard and get some fresh air and some exercise.    Participation Level:  Active  Participation Quality:  Appropriate  Affect:  Appropriate  Cognitive:  Appropriate  Insight: Appropriate  Engagement in Group:  Engaged  Modes of Intervention:  Activity  Additional Comments:    Frank Holden 11/26/2023, 7:04 PM

## 2023-11-27 LAB — HEMOGLOBIN A1C
Hgb A1c MFr Bld: 5.4 % (ref 4.8–5.6)
Mean Plasma Glucose: 108.28 mg/dL

## 2023-11-27 LAB — LIPID PANEL
Cholesterol: 133 mg/dL (ref 0–200)
HDL: 39 mg/dL — ABNORMAL LOW (ref >40)
LDL Cholesterol: 84 mg/dL (ref 0–99)
Total CHOL/HDL Ratio: 3.4 ratio
Triglycerides: 48 mg/dL (ref <150)
VLDL: 10 mg/dL (ref 0–40)

## 2023-11-27 NOTE — Group Note (Signed)
 Date:  11/27/2023 Time:  4:42 PM  Group Topic/Focus:  Wellness Toolbox:   The focus of this group is to discuss various aspects of wellness, balancing those aspects and exploring ways to increase the ability to experience wellness.  Patients will create a wellness toolbox for use upon discharge.    Participation Level:  Active  Participation Quality:  Appropriate  Affect:  Appropriate  Cognitive:  Appropriate  Insight: Appropriate  Engagement in Group:  Engaged  Modes of Intervention:  Activity and Socialization  Additional Comments:    Deitra Caron Mainland 11/27/2023, 4:42 PM

## 2023-11-27 NOTE — Group Note (Signed)
 [facility] LCSW Group Therapy Note   Group Date: 11/27/2023 Start Time: 1300 End Time: 1400   Type of Therapy/Topic:  Group Therapy:  Emotion Regulation  Participation Level:  Did Not Attend   Mood:  Description of Group:    The purpose of this group is to assist patients in learning to regulate negative emotions and experience positive emotions. Patients will be guided to discuss ways in which they have been vulnerable to their negative emotions. These vulnerabilities will be juxtaposed with experiences of positive emotions or situations, and patients challenged to use positive emotions to combat negative ones. Special emphasis will be placed on coping with negative emotions in conflict situations, and patients will process healthy conflict resolution skills.  Therapeutic Goals: Patient will identify two positive emotions or experiences to reflect on in order to balance out negative emotions:  Patient will label two or more emotions that they find the most difficult to experience:  Patient will be able to demonstrate positive conflict resolution skills through discussion or role plays:   Summary of Patient Progress:   Patient did not attend.     Therapeutic Modalities:   Cognitive Behavioral Therapy Feelings Identification Dialectical Behavioral Therapy   Alveta CHRISTELLA Kerns, LCSW

## 2023-11-27 NOTE — Group Note (Signed)
 Date:  11/27/2023 Time:  11:22 AM  Group Topic/Focus:  Goals Group:   The focus of this group is to help patients establish daily goals to achieve during treatment and discuss how the patient can incorporate goal setting into their daily lives to aide in recovery.    Participation Level:  Active  Participation Quality:  Appropriate  Affect:  Appropriate  Cognitive:  Appropriate  Insight: Appropriate  Engagement in Group:  Engaged  Modes of Intervention:  Discussion, Education, and Support  Additional Comments:    Frank Holden 11/27/2023, 11:22 AM

## 2023-11-27 NOTE — [facility] Counselor (Signed)
 CSW met with pt to discuss discharge/aftercare plans. Pt reported plans to return home and that his mother could provide transportation potentially. He shared that otherwise he may have to take a taxi. Pt has clothes to wear at discharge. He reported that he prefers in-person for his aftercare. Pt denied any use of tobacco products or substances. No other concerns expressed. Contact ended without incident.   Nadara SAUNDERS. Chaim, MSW, LCSW, LCAS 11/27/2023 4:02 PM

## 2023-11-27 NOTE — Progress Notes (Signed)
 Pt visible in the milieu and noted appropriately interacting with peers, attend and participated in wrap up group.  Pt denied SI/HI, delusional and paranoia thoughts, anxiety and depression.  He remains care compliant.    11/26/23 2200  Psych Admission Type (Psych Patients Only)  Admission Status Voluntary  Psychosocial Assessment  Patient Complaints None  Eye Contact Fair  Facial Expression Animated  Affect Appropriate to circumstance  Speech Logical/coherent  Interaction Assertive  Motor Activity Other (Comment) (WDL)  Appearance/Hygiene Unremarkable  Behavior Characteristics Cooperative;Appropriate to situation  Mood Pleasant  Aggressive Behavior  Effect No apparent injury  Thought Process  Coherency WDL  Content WDL  Delusions None reported or observed  Perception WDL  Hallucination None reported or observed  Judgment WDL  Confusion None  Danger to Self  Current suicidal ideation? Denies  Agreement Not to Harm Self No  Description of Agreement VERBAL  Danger to Others  Danger to Others None reported or observed    Problem: Education: Goal: Knowledge of  General Education information/materials will improve Outcome: Progressing Goal: Emotional status will improve Outcome: Progressing Goal: Mental status will improve Outcome: Progressing Goal: Verbalization of understanding the information provided will improve Outcome: Progressing   Problem: Activity: Goal: Interest or engagement in activities will improve Outcome: Progressing Goal: Sleeping patterns will improve Outcome: Progressing   Problem: Coping: Goal: Ability to verbalize frustrations and anger appropriately will improve Outcome: Progressing Goal: Ability to demonstrate self-control will improve Outcome: Progressing

## 2023-11-27 NOTE — Plan of Care (Signed)

## 2023-11-27 NOTE — Progress Notes (Signed)
 [facility] MD Progress Note  11/27/2023 9:45 AM Frank Holden  MRN:  [id number]   The patient is a 18 year old single male from [place], [place] , who presented voluntarily to [facility] accompanied by his mother due to worsening depression, suicidal ideation without current plan or intent, and racing thoughts. He reports experiencing depressive symptoms for the past five years, which he attributes to the death of his great-grandmother. He describes his mental health as "getting worse" and endorses daily feelings of sadness, hopelessness, worthlessness, irritability, anhedonia, crying spells, and self-isolation. He reports significant difficulty sleeping, staying awake until 4-5 AM due to racing thoughts. He also endorses episodes of increased energy and mood lability a few times per week, followed by depression and anxiety.   Subjective:  Chart reviewed, case discussed in multidisciplinary meeting, patient seen during rounds.   Patient seen today for follow-up psychiatric evaluation. He is noted to be on the unit, engaged with staff and peers, and participating appropriately. He reports improved mood and denies depression, anxiety, suicidal ideation (SI), or homicidal ideation (HI). He denies hallucinations, delusions, or other psychotic symptoms. He also denies any medication side effects.  He reflects on recent stressors at home that contributed to his mood decompensation at the time of admission and expresses improved insight. He is motivated to continue addressing his mental health in outpatient therapy. He reports close family support, including his mother and aunts. He demonstrates future-oriented thinking, sharing plans to seek employment following discharge.   Call to mom, Frank Holden [phone], have pt verbal permission to speak with mother related to plan of care and discharge planning, per mom, Frank Holden sounds great a lot better than when brought in she reports she is in favor of him coming  home.  Mom denies pt has ever tried to harm self before, reports noted depression getting worse particularly related to grandmother passing Mom verbalizes her support as well as that of multiple aunts on discharge, states pt will live with mom or other trusted close family at discharge.    Past Psychiatric History: see h&P Family History: History reviewed. No pertinent family history. Social History:  Social History   Substance and Sexual Activity  Alcohol Use No     Social History   Substance and Sexual Activity  Drug Use No    Social History   Socioeconomic History   Marital status: Single    Spouse name: Not on file   Number of children: Not on file   Years of education: Not on file   Highest education level: Not on file  Occupational History   Not on file  Tobacco Use   Smoking status: Never   Smokeless tobacco: Never  Substance and Sexual Activity   Alcohol use: No   Drug use: No   Sexual activity: Not on file  Other Topics Concern   Not on file  Social History Narrative   Lives at home with great-grandmother Frank Holden, has custody).   His mother lives in [place], does not see him often.   Do not know father.    No pets.          Social Drivers of Corporate investment banker Strain: Not on file  Food Insecurity: No Food Insecurity (11/24/2023)   Hunger Vital Sign    Worried About Running Out of Food in the Last Year: Never true    Ran Out of Food in the Last Year: Never true  Transportation Needs: No Transportation Needs (11/24/2023)   PRAPARE -  Administrator, Civil Service (Medical): No    Lack of Transportation (Non-Medical): No  Physical Activity: Not on file  Stress: Not on file  Social Connections: Moderately Isolated (11/24/2023)   Social Connection and Isolation Panel    Frequency of Communication with Friends and Family: Never    Frequency of Social Gatherings with Friends and Family: Never    Attends Religious Services: Never     Database administrator or Organizations: Yes    Attends Banker Meetings: Never    Marital Status: Married   Past Medical History: History reviewed. No pertinent past medical history.  Past Surgical History:  Procedure Laterality Date   LAPAROSCOPIC APPENDECTOMY N/A 12/05/2020   Procedure: APPENDECTOMY LAPAROSCOPIC;  Surgeon: Sebastian Moles, MD;  Location: [facility] OR;  Service: General;  Laterality: N/A;    Current Medications: Current Facility-Administered Medications  Medication Dose Route Frequency Provider Last Rate Last Admin   acetaminophen  (TYLENOL ) tablet 650 mg  650 mg Oral Q6H PRN Bobbitt, Shalon E, NP       alum & mag hydroxide-simeth (MAALOX/MYLANTA) 200-200-20 MG/5ML suspension 30 mL  30 mL Oral Q4H PRN Bobbitt, Shalon E, NP       ARIPiprazole  (ABILIFY ) tablet 5 mg  5 mg Oral Daily Bobbitt, Shalon E, NP   5 mg at 11/27/23 9182   haloperidol  (HALDOL ) tablet 5 mg  5 mg Oral TID PRN Bobbitt, Shalon E, NP       And   diphenhydrAMINE  (BENADRYL ) capsule 50 mg  50 mg Oral TID PRN Bobbitt, Shalon E, NP       haloperidol  lactate (HALDOL ) injection 5 mg  5 mg Intramuscular TID PRN Bobbitt, Shalon E, NP       And   diphenhydrAMINE  (BENADRYL ) injection 50 mg  50 mg Intramuscular TID PRN Bobbitt, Shalon E, NP       And   LORazepam  (ATIVAN ) injection 2 mg  2 mg Intramuscular TID PRN Bobbitt, Shalon E, NP       haloperidol  lactate (HALDOL ) injection 10 mg  10 mg Intramuscular TID PRN Bobbitt, Shalon E, NP       And   diphenhydrAMINE  (BENADRYL ) injection 50 mg  50 mg Intramuscular TID PRN Bobbitt, Shalon E, NP       And   LORazepam  (ATIVAN ) injection 2 mg  2 mg Intramuscular TID PRN Bobbitt, Shalon E, NP       escitalopram  (LEXAPRO ) tablet 10 mg  10 mg Oral Daily Millington, Matthew E, PA-C   10 mg at 11/27/23 9182   hydrOXYzine  (ATARAX ) tablet 25 mg  25 mg Oral TID PRN Bobbitt, Shalon E, NP       magnesium  hydroxide (MILK OF MAGNESIA) suspension 30 mL  30 mL Oral Daily PRN  Bobbitt, Shalon E, NP       melatonin tablet 5 mg  5 mg Oral QHS PRN Bobbitt, Shalon E, NP   5 mg at 11/26/23 2107    Lab Results:  Results for orders placed or performed during the hospital encounter of 11/24/23 (from the past 48 hours)  Lipid panel     Status: Abnormal   Collection Time: 11/27/23  6:22 AM  Result Value Ref Range   Cholesterol 133 0 - 200 mg/dL   Triglycerides 48 <849 mg/dL   HDL 39 (L) >59 mg/dL   Total CHOL/HDL Ratio 3.4 RATIO   VLDL 10 0 - 40 mg/dL   LDL Cholesterol 84 0 - 99 mg/dL  Comment:        Total Cholesterol/HDL:CHD Risk Coronary Heart Disease Risk Table                     Men   Women  1/2 Average Risk   3.4   3.3  Average Risk       5.0   4.4  2 X Average Risk   9.6   7.1  3 X Average Risk  23.4   11.0        Use the calculated Patient Ratio above and the CHD Risk Table to determine the patient's CHD Risk.        ATP III CLASSIFICATION (LDL):  <100     mg/dL   Optimal  899-870  mg/dL   Near or Above                    Optimal  130-159  mg/dL   Borderline  839-810  mg/dL   High  >809     mg/dL   Very High Performed at [facility], [address] [place], [place] [address]      Blood Alcohol level:  Lab Results  Component Value Date   [facility] <15 11/23/2023    Metabolic Disorder Labs: No results found for: HGBA1C, MPG No results found for: PROLACTIN Lab Results  Component Value Date   CHOL 133 11/27/2023   TRIG 48 11/27/2023   HDL 39 (L) 11/27/2023   CHOLHDL 3.4 11/27/2023   VLDL 10 11/27/2023   LDLCALC 84 11/27/2023      Psychiatric Specialty Exam:  Presentation  General Appearance:  Appropriate for Environment  Eye Contact: Good  Speech: Normal Rate  Speech Volume: Normal    Mood and Affect  Mood: Depressed Improved brighter smiles appropriately Affect: brightens with engagement Flat   Thought Process  Thought Processes: Goal Directed; Linear  Descriptions of  Associations:Intact  Orientation:Full (Time, Place and Person)  Thought Content:Logical  Hallucinations:Hallucinations: None  Ideas of Reference:None  Suicidal Thoughts:Suicidal Thoughts: No  Homicidal Thoughts:Homicidal Thoughts: No   Sensorium  Memory: Immediate Good; Remote Good  Judgment: Fair  Insight: Fair   Art therapist  Concentration: Good  Attention Span: Good  Recall: Good  Fund of Knowledge: Good  Language: Good   Psychomotor Activity  Psychomotor Activity:Psychomotor Activity: Normal  Musculoskeletal: Strength & Muscle Tone: within normal limits Gait & Station: normal Assets  Assets: Housing; Manufacturing systems engineer; Physical Health; Social Support    Physical Exam: Physical Exam ROS Blood pressure 106/78, pulse 81, temperature (!) 97.5 F (36.4 C), resp. rate 18, SpO2 99%. There is no height or weight on file to calculate BMI.  Diagnosis: Principal Problem:   MDD (major depressive disorder), recurrent episode, severe (HCC)  The patient is a 18 year old male with no prior psychiatric medication history, presenting with worsening depressive symptoms, generalized anxiety, racing thoughts, and passive suicidal ideation without plan or intent, in the context of unresolved grief and probable mood instability. His clinical picture is significant for chronic depression, episodic mood elevation, sleep disturbance, and prior suicide attempt.  Patient demonstrates ongoing clinical improvement. He is calm, cooperative, and oriented, with no active mood or psychotic symptoms. He reflects insight into prior stressors and reports strong social support. No safety concerns identified. Continued progress toward discharge readiness is evident.  Patient continues to require this acute inpatient psychiatric setting for short-term observation and discharge coordination, but no further medication adjustments are indicated.  PLAN: Safety and  Monitoring:  -- Voluntary admission to inpatient psychiatric unit for safety, stabilization and treatment  -- Daily contact with patient to assess and evaluate symptoms and progress in treatment  -- Patient's case to be discussed in multi-disciplinary team meeting  -- Observation Level : q15 minute checks  -- Vital signs:  q12 hours  -- Precautions: suicide, elopement, and assault -- Encouraged patient to participate in unit milieu and in scheduled group therapies  2. Psychiatric Diagnoses and Treatment:               Unspecified mood disorder  Complex grief              -- Continue abilify  5 mg daily             -- Start lexapro  10 mg daily      3. Medical Issues Being Addressed:  no acute concerns   4. Discharge Planning:   -- Social work and case management to assist with discharge planning and identification of hospital follow-up needs prior to discharge  -- Estimated LOS: 3-4 days  Hoy CHRISTELLA Pinal, NP 11/27/2023, 9:45 AM

## 2023-11-27 NOTE — Group Note (Signed)
 Date:  11/27/2023 Time:  9:06 PM  Group Topic/Focus:  Orientation:   The focus of this group is to educate the patient on the purpose and policies of crisis stabilization and provide a format to answer questions about their admission.  The group details unit policies and expectations of patients while admitted.    Participation Level:  Active  Participation Quality:  Appropriate and Attentive  Affect:  Appropriate  Cognitive:  Alert and Appropriate  Insight: Appropriate and Good  Engagement in Group:  Engaged and Improving  Modes of Intervention:  Clarification, Discussion, Education, Orientation, Rapport Building, and Support  Additional Comments:     Roylee Chaffin 11/27/2023, 9:06 PM

## 2023-11-27 NOTE — Progress Notes (Signed)
   11/27/23 0817  Psych Admission Type (Psych Patients Only)  Admission Status Voluntary  Psychosocial Assessment  Patient Complaints None  Eye Contact Fair  Facial Expression Animated  Affect Appropriate to circumstance  Speech Logical/coherent  Interaction Assertive  Motor Activity Slow  Appearance/Hygiene Unremarkable  Behavior Characteristics Cooperative;Appropriate to situation  Mood Pleasant  Aggressive Behavior  Effect No apparent injury  Thought Process  Coherency WDL  Content WDL  Delusions None reported or observed  Perception WDL  Hallucination None reported or observed  Judgment WDL  Confusion None  Danger to Self  Current suicidal ideation? Denies  Self-Injurious Behavior No self-injurious ideation or behavior indicators observed or expressed   Agreement Not to Harm Self Yes  Description of Agreement verbal  Danger to Others  Danger to Others None reported or observed

## 2023-11-28 LAB — LIPID PANEL
Cholesterol: 133 mg/dL (ref 0–200)
HDL: 38 mg/dL — ABNORMAL LOW (ref >40)
LDL Cholesterol: 83 mg/dL (ref 0–99)
Total CHOL/HDL Ratio: 3.5 ratio
Triglycerides: 59 mg/dL (ref <150)
VLDL: 12 mg/dL (ref 0–40)

## 2023-11-28 LAB — HEMOGLOBIN A1C
Hgb A1c MFr Bld: 5.4 % (ref 4.8–5.6)
Mean Plasma Glucose: 108.28 mg/dL

## 2023-11-28 NOTE — Progress Notes (Signed)
   11/28/23 0500  Psych Admission Type (Psych Patients Only)  Admission Status Voluntary  Psychosocial Assessment  Patient Complaints None  Eye Contact Fair  Facial Expression Animated  Affect Appropriate to circumstance  Speech Logical/coherent  Interaction Assertive  Motor Activity Slow  Appearance/Hygiene Unremarkable  Behavior Characteristics Cooperative;Appropriate to situation  Mood Pleasant  Aggressive Behavior  Effect No apparent injury  Thought Process  Coherency WDL  Content WDL  Delusions None reported or observed  Perception WDL  Hallucination None reported or observed  Judgment WDL  Confusion None  Danger to Self  Agreement Not to Harm Self Yes  Danger to Others  Danger to Others None reported or observed

## 2023-11-28 NOTE — Group Note (Signed)
 Date:  11/28/2023 Time:  8:56 PM  Group Topic/Focus:  Self Care:   The focus of this group is to help patients understand the importance of self-care in order to improve or restore emotional, physical, spiritual, interpersonal, and financial health.    Participation Level:  Minimal  Participation Quality:  Appropriate  Affect:  Appropriate  Cognitive:  Appropriate  Insight: Appropriate  Engagement in Group:  Limited  Modes of Intervention:  Discussion and Support  Additional Comments:     Kerri Katz 11/28/2023, 8:56 PM

## 2023-11-28 NOTE — Plan of Care (Signed)
   Problem: Education: Goal: Knowledge of [facility] Education information/materials will improve Outcome: Progressing Goal: Emotional status will improve Outcome: Progressing Goal: Mental status will improve Outcome: Progressing Goal: Verbalization of understanding the information provided will improve Outcome: Progressing

## 2023-11-28 NOTE — Group Note (Signed)
 Date:  11/28/2023 Time:  10:27 AM  Group Topic/Focus:  Goals Group:   The focus of this group is to help patients establish daily goals to achieve during treatment and discuss how the patient can incorporate goal setting into their daily lives to aide in recovery.    Participation Level:  Active  Participation Quality:  Appropriate  Affect:  Appropriate  Cognitive:  Alert  Insight: Appropriate  Engagement in Group:  Engaged  Modes of Intervention:  Activity, Discussion, and Education  Additional Comments:    Skippy LITTIE Bennett 11/28/2023, 10:27 AM

## 2023-11-28 NOTE — B[facility] Counselor (Addendum)
 CSW met with pt briefly to discharge plans. He was informed that he has Medicaid and therefore his aftercare could not be arranged through [facility]. Pt and CSW discussed [facility]. Pt in agreement with [facility] for aftercare appointment.   Pt and CSW discuss transportation for tomorrow. He shared that his mother has to work but that she would be sending someone else to come pick him up instead. No other concerns expressed. Contact ended without incident.   Nadara SAUNDERS. Chaim, MSW, LCSW, LCAS 11/28/2023 3:38 PM

## 2023-11-28 NOTE — Progress Notes (Signed)
   11/28/23 1000  Psych Admission Type (Psych Patients Only)  Admission Status Voluntary  Psychosocial Assessment  Patient Complaints None  Eye Contact Fair  Facial Expression Animated  Affect Appropriate to circumstance  Speech Logical/coherent  Interaction Assertive  Motor Activity Slow  Appearance/Hygiene Unremarkable  Behavior Characteristics Cooperative;Appropriate to situation  Mood Pleasant  Aggressive Behavior  Effect No apparent injury  Thought Process  Coherency WDL  Content WDL  Delusions None reported or observed  Perception WDL  Hallucination None reported or observed  Judgment WDL  Confusion None  Danger to Self  Current suicidal ideation? Denies  Self-Injurious Behavior No self-injurious ideation or behavior indicators observed or expressed   Agreement Not to Harm Self Yes  Description of Agreement Verbal  Danger to Others  Danger to Others None reported or observed

## 2023-11-28 NOTE — Group Note (Signed)
 LCSW Group Therapy Note  Group Date: 11/28/2023 Start Time: 1310 End Time: 1400   Type of Therapy and Topic:  Group Therapy - How To Cope with Nervousness about Discharge   Participation Level:  Did Not Attend   Description of Group This process group involved identification of patients' feelings about discharge. Some of them are scheduled to be discharged soon, while others are new admissions, but each of them was asked to share thoughts and feelings surrounding discharge from the hospital. One common theme was that they are excited at the prospect of going home, while another was that many of them are apprehensive about sharing why they were hospitalized. Patients were given the opportunity to discuss these feelings with their peers in preparation for discharge.  Therapeutic Goals  Patient will identify their overall feelings about pending discharge. Patient will think about how they might proactively address issues that they believe will once again arise once they get home (i.e. with parents). Patients will participate in discussion about having hope for change.   Summary of Patient Progress:   X   Therapeutic Modalities Cognitive Behavioral Therapy   Sherryle JINNY Margo, LCSW 11/28/2023  3:12 PM

## 2023-11-28 NOTE — Group Note (Signed)
 Recreation Therapy Group Note   Group Topic:Leisure Education  Group Date: 11/28/2023 Start Time: 1000 End Time: 1050 Facilitators: Celestia Jeoffrey BRAVO, LRT, CTRS Location: Courtyard  Group Description: Music. Patients encouraged to name their favorite song(s) for LRT to play song through speaker for group to hear, while in the courtyard getting fresh air and sunlight. Patients educated on the definition of leisure and the importance of having different leisure interests outside of the hospital. Group discussed how leisure activities can often be used as Pharmacologist and that listening to music and being outside are examples.    Goal Area(s) Addressed:  Patient will identify a current leisure interest.  Patient will practice making a positive decision. Patient will have the opportunity to try a new leisure activity.   Affect/Mood: Appropriate   Participation Level: Active and Engaged   Participation Quality: Independent   Behavior: Appropriate   Speech/Thought Process: Coherent   Insight: Good   Judgement: Good   Modes of Intervention: Music, Rapport Building, and Socialization   Patient Response to Interventions:  Attentive, Engaged, Interested , and Receptive   Education Outcome:  Acknowledges education   Clinical Observations/Individualized Feedback: Frank Holden was active in their participation of session activities and group discussion. Pt interacted well with LRT and peers duration of session.    Plan: Continue to engage patient in RT group sessions 2-3x/week.   Jeoffrey BRAVO Celestia, LRT, CTRS 11/28/2023 1:47 PM

## 2023-11-28 NOTE — Group Note (Signed)
 Date:  11/28/2023 Time:  7:15 PM  Group Topic/Focus:  Activity Group: The focus of the group is to promote activity for the patients and encourage them to go outside to the courtyard and get some fresh air and some exercise.    Participation Level:  Active  Participation Quality:  Appropriate  Affect:  Appropriate  Cognitive:  Appropriate  Insight: Appropriate  Engagement in Group:  Engaged  Modes of Intervention:  Activity  Additional Comments:    Camellia HERO Windsor Zirkelbach 11/28/2023, 7:15 PM

## 2023-11-28 NOTE — Plan of Care (Signed)
   Problem: Education: Goal: Emotional status will improve Outcome: Progressing Goal: Mental status will improve Outcome: Progressing

## 2023-11-28 NOTE — Progress Notes (Signed)
 [facility] MD Progress Note  11/28/2023 4:52 PM [place]  MRN:  [id number]   The patient is a 18 year old single male from [place], [place] , who presented voluntarily to [facility] accompanied by his mother due to worsening depression, suicidal ideation without current plan or intent, and racing thoughts. He reports experiencing depressive symptoms for the past five years, which he attributes to the death of his great-grandmother. He describes his mental health as "getting worse" and endorses daily feelings of sadness, hopelessness, worthlessness, irritability, anhedonia, crying spells, and self-isolation. He reports significant difficulty sleeping, staying awake until 4-5 AM due to racing thoughts. He also endorses episodes of increased energy and mood lability a few times per week, followed by depression and anxiety.   Subjective:  Chart reviewed, case discussed in multidisciplinary meeting, patient seen during rounds.   Patient seen today for follow-up psychiatric evaluation. He reports continued improvement in mood and overall well-being. States, "It's helped me a lot to be in this place," noting he has learned coping skills and that the change of environment has given him a new perspective. He denies any thoughts of harming himself or others and expresses readiness to return home. Patient continues to demonstrate significant improvement in mood, engagement, and insight. He denies any current safety concerns and is participating actively in therapeutic activities. There are no signs of psychosis, mania, or mood instability. He expresses appreciation for treatment and reports learning coping strategies. He is stable, goal-directed, and ready for discharge.  Patient continues to require this acute inpatient setting only through completion of discharge planning. No changes to current medications or treatment plan are indicated at this time.    11/27/23: Patient seen today for follow-up psychiatric  evaluation. He is noted to be on the unit, engaged with staff and peers, and participating appropriately. He reports improved mood and denies depression, anxiety, suicidal ideation (SI), or homicidal ideation (HI). He denies hallucinations, delusions, or other psychotic symptoms. He also denies any medication side effects. He reflects on recent stressors at home that contributed to his mood decompensation at the time of admission and expresses improved insight. He is motivated to continue addressing his mental health in outpatient therapy. He reports close family support, including his mother and aunts. He demonstrates future-oriented thinking, sharing plans to seek employment following discharge.  Call to mom, [place] [phone], have pt verbal permission to speak with mother related to plan of care and discharge planning, per mom, [place] sounds great a lot better than when brought in she reports she is in favor of him coming home.  Mom denies pt has ever tried to harm self before, reports noted depression getting worse particularly related to grandmother passing Mom verbalizes her support as well as that of multiple aunts on discharge, states pt will live with mom or other trusted close family at discharge.    Past Psychiatric History: see h&P Family History: History reviewed. No pertinent family history. Social History:  Social History   Substance and Sexual Activity  Alcohol Use No     Social History   Substance and Sexual Activity  Drug Use No    Social History   Socioeconomic History   Marital status: Single    Spouse name: Not on file   Number of children: Not on file   Years of education: Not on file   Highest education level: Not on file  Occupational History   Not on file  Tobacco Use   Smoking status: Never   Smokeless tobacco:  Never  Substance and Sexual Activity   Alcohol use: No   Drug use: No   Sexual activity: Not on file  Other Topics Concern   Not on file   Social History Narrative   Lives at home with great-grandmother [place], has custody).   His mother lives in [place], does not see him often.   Do not know father.    No pets.          Social Drivers of Corporate investment banker Strain: Not on file  Food Insecurity: No Food Insecurity (11/24/2023)   Hunger Vital Sign    Worried About Running Out of Food in the Last Year: Never true    Ran Out of Food in the Last Year: Never true  Transportation Needs: No Transportation Needs (11/24/2023)   PRAPARE - Administrator, Civil Service (Medical): No    Lack of Transportation (Non-Medical): No  Physical Activity: Not on file  Stress: Not on file  Social Connections: Moderately Isolated (11/24/2023)   Social Connection and Isolation Panel    Frequency of Communication with Friends and Family: Never    Frequency of Social Gatherings with Friends and Family: Never    Attends Religious Services: Never    Database administrator or Organizations: Yes    Attends Banker Meetings: Never    Marital Status: Married   Past Medical History: History reviewed. No pertinent past medical history.  Past Surgical History:  Procedure Laterality Date   LAPAROSCOPIC APPENDECTOMY N/A 12/05/2020   Procedure: APPENDECTOMY LAPAROSCOPIC;  Surgeon: Frank Moles, MD;  Location: [facility] OR;  Service: General;  Laterality: N/A;    Current Medications: Current Facility-Administered Medications  Medication Dose Route Frequency Provider Last Rate Last Admin   acetaminophen  (TYLENOL ) tablet 650 mg  650 mg Oral Q6H PRN [place], Frank E, NP       alum & mag hydroxide-simeth (MAALOX/MYLANTA) 200-200-20 MG/5ML suspension 30 mL  30 mL Oral Q4H PRN [place], Frank E, NP       ARIPiprazole  (ABILIFY ) tablet 5 mg  5 mg Oral Daily [place], Frank E, NP   5 mg at 11/28/23 9160   haloperidol  (HALDOL ) tablet 5 mg  5 mg Oral TID PRN [place], Frank E, NP       And   diphenhydrAMINE  (BENADRYL )  capsule 50 mg  50 mg Oral TID PRN [place], Frank E, NP       haloperidol  lactate (HALDOL ) injection 5 mg  5 mg Intramuscular TID PRN [place], Frank E, NP       And   diphenhydrAMINE  (BENADRYL ) injection 50 mg  50 mg Intramuscular TID PRN [place], Frank E, NP       And   LORazepam  (ATIVAN ) injection 2 mg  2 mg Intramuscular TID PRN [place], Frank E, NP       haloperidol  lactate (HALDOL ) injection 10 mg  10 mg Intramuscular TID PRN [place], Frank E, NP       And   diphenhydrAMINE  (BENADRYL ) injection 50 mg  50 mg Intramuscular TID PRN [place], Frank E, NP       And   LORazepam  (ATIVAN ) injection 2 mg  2 mg Intramuscular TID PRN [place], Frank E, NP       escitalopram  (LEXAPRO ) tablet 10 mg  10 mg Oral Daily Millington, Matthew E, PA-C   10 mg at 11/28/23 9160   hydrOXYzine  (ATARAX ) tablet 25 mg  25 mg Oral TID PRN [place], Frank E, NP  magnesium  hydroxide (MILK OF MAGNESIA) suspension 30 mL  30 mL Oral Daily PRN [place], Frank E, NP       melatonin tablet 5 mg  5 mg Oral QHS PRN [place], Frank E, NP   5 mg at 11/27/23 2102    Lab Results:  Results for orders placed or performed during the [facility] encounter of 11/24/23 (from the past 48 hours)  Hemoglobin A1c     Status: None   Collection Time: 11/27/23  6:22 AM  Result Value Ref Range   Hgb A1c MFr Bld 5.4 4.8 - 5.6 %    Comment: (NOTE) Diagnosis of Diabetes The following HbA1c ranges recommended by the American Diabetes Association (ADA) may be used as an aid in the diagnosis of diabetes mellitus.  Hemoglobin             Suggested A1C NGSP%              Diagnosis  <5.7                   Non Diabetic  5.7-6.4                Pre-Diabetic  >6.4                   Diabetic  <7.0                   Glycemic control for                       adults with diabetes.     Mean Plasma Glucose 108.28 mg/dL    Comment: Performed at [facility], [address] [place], [place] [address]  Lipid panel     Status:  Abnormal   Collection Time: 11/27/23  6:22 AM  Result Value Ref Range   Cholesterol 133 0 - 200 mg/dL   Triglycerides 48 <849 mg/dL   HDL 39 (L) >59 mg/dL   Total CHOL/HDL Ratio 3.4 RATIO   VLDL 10 0 - 40 mg/dL   LDL Cholesterol 84 0 - 99 mg/dL    Comment:        Total Cholesterol/HDL:CHD Risk Coronary Heart Disease Risk Table                     Men   Women  1/2 Average Risk   3.4   3.3  Average Risk       5.0   4.4  2 X Average Risk   9.6   7.1  3 X Average Risk  23.4   11.0        Use the calculated Patient Ratio above and the CHD Risk Table to determine the patient's CHD Risk.        ATP III CLASSIFICATION (LDL):  <100     mg/dL   Optimal  899-870  mg/dL   Near or Above                    Optimal  130-159  mg/dL   Borderline  839-810  mg/dL   High  >809     mg/dL   Very High Performed at [facility], [address] [place], [place] [address]   Lipid panel     Status: Abnormal   Collection Time: 11/28/23  6:27 AM  Result Value Ref Range   Cholesterol 133 0 - 200 mg/dL   Triglycerides 59 <849  mg/dL   HDL 38 (L) >59 mg/dL   Total CHOL/HDL Ratio 3.5 RATIO   VLDL 12 0 - 40 mg/dL   LDL Cholesterol 83 0 - 99 mg/dL    Comment:        Total Cholesterol/HDL:CHD Risk Coronary Heart Disease Risk Table                     Men   Women  1/2 Average Risk   3.4   3.3  Average Risk       5.0   4.4  2 X Average Risk   9.6   7.1  3 X Average Risk  23.4   11.0        Use the calculated Patient Ratio above and the CHD Risk Table to determine the patient's CHD Risk.        ATP III CLASSIFICATION (LDL):  <100     mg/dL   Optimal  899-870  mg/dL   Near or Above                    Optimal  130-159  mg/dL   Borderline  839-810  mg/dL   High  >809     mg/dL   Very High Performed at [facility], [address] [place], [place] [address]   Hemoglobin A1c     Status: None   Collection Time: 11/28/23  6:27 AM  Result Value Ref Range   Hgb A1c MFr Bld 5.4  4.8 - 5.6 %    Comment: (NOTE) Diagnosis of Diabetes The following HbA1c ranges recommended by the American Diabetes Association (ADA) may be used as an aid in the diagnosis of diabetes mellitus.  Hemoglobin             Suggested A1C NGSP%              Diagnosis  <5.7                   Non Diabetic  5.7-6.4                Pre-Diabetic  >6.4                   Diabetic  <7.0                   Glycemic control for                       adults with diabetes.     Mean Plasma Glucose 108.28 mg/dL    Comment: Performed at [facility], [address] [place], [place] [address]     Blood Alcohol level:  Lab Results  Component Value Date   [facility] <15 11/23/2023    Metabolic Disorder Labs: Lab Results  Component Value Date   HGBA1C 5.4 11/28/2023   MPG 108.28 11/28/2023   MPG 108.28 11/27/2023   No results found for: PROLACTIN Lab Results  Component Value Date   CHOL 133 11/28/2023   TRIG 59 11/28/2023   HDL 38 (L) 11/28/2023   CHOLHDL 3.5 11/28/2023   VLDL 12 11/28/2023   LDLCALC 83 11/28/2023   LDLCALC 84 11/27/2023      Psychiatric Specialty Exam:  Presentation  General Appearance:  Appropriate for Environment  Eye Contact: Good  Speech: Normal Rate  Speech Volume: Normal    Mood and Affect  Mood: Depressed Improved brighter smiles appropriately Affect:  brightens with engagement Flat   Thought Process  Thought Processes: Goal Directed; Linear  Descriptions of Associations:Intact  Orientation:Full (Time, Place and Person)  Thought Content:Logical  Hallucinations:No data recorded  Ideas of Reference:None  Suicidal Thoughts:No data recorded  Homicidal Thoughts:No data recorded   Sensorium  Memory: Immediate Good; Remote Good  Judgment: Fair  Insight: Fair   Art therapist  Concentration: Good  Attention Span: Good  Recall: Good  Fund of Knowledge: Good  Language: Good   Psychomotor Activity   Psychomotor Activity:No data recorded  Musculoskeletal: Strength & Muscle Tone: within normal limits Gait & Station: normal Assets  Assets: Housing; Manufacturing systems engineer; Physical Health; Social Support    Physical Exam: Physical Exam ROS Blood pressure 123/75, pulse 84, temperature 98.1 F (36.7 C), resp. rate 18, SpO2 97%. There is no height or weight on file to calculate BMI.  Diagnosis: Principal Problem:   MDD (major depressive disorder), recurrent episode, severe (HCC)  The patient is a 18 year old male with no prior psychiatric medication history, presenting with worsening depressive symptoms, generalized anxiety, racing thoughts, and passive suicidal ideation without plan or intent, in the context of unresolved grief and probable mood instability. His clinical picture is significant for chronic depression, episodic mood elevation, sleep disturbance, and prior suicide attempt.  Patient demonstrates ongoing clinical improvement. He is calm, cooperative, and oriented, with no active mood or psychotic symptoms. He reflects insight into prior stressors and reports strong social support. No safety concerns identified. Continued progress toward discharge readiness is evident.  Patient continues to require this acute inpatient psychiatric setting for short-term observation and discharge coordination, but no further medication adjustments are indicated.  PLAN: Safety and Monitoring:  -- Voluntary admission to inpatient psychiatric unit for safety, stabilization and treatment  -- Daily contact with patient to assess and evaluate symptoms and progress in treatment  -- Patient's case to be discussed in multi-disciplinary team meeting  -- Observation Level : q15 minute checks  -- Vital signs:  q12 hours  -- Precautions: suicide, elopement, and assault -- Encouraged patient to participate in unit milieu and in scheduled group therapies  2. Psychiatric Diagnoses and Treatment:                Unspecified mood disorder  Complex grief              -- Continue abilify  5 mg daily             -- Start lexapro  10 mg daily      3. Medical Issues Being Addressed:  no acute concerns   4. Discharge Planning:   -- Social work and case management to assist with discharge planning and identification of [facility] follow-up needs prior to discharge  -- Estimated LOS: 3-4 days  Hoy CHRISTELLA Pinal, NP 11/28/2023, 4:52 PM

## 2023-11-29 DIAGNOSIS — F332 Major depressive disorder, recurrent severe without psychotic features: Principal | ICD-10-CM

## 2023-11-29 MED ORDER — ARIPIPRAZOLE 5 MG PO TABS: 5.0000 mg | ORAL_TABLET | Freq: Every day | ORAL | 0 refills | Status: AC

## 2023-11-29 MED ORDER — ESCITALOPRAM OXALATE 10 MG PO TABS: 10.0000 mg | ORAL_TABLET | Freq: Every day | ORAL | 0 refills | Status: AC

## 2023-11-29 NOTE — Progress Notes (Signed)
 [facility] Discharge Suicide Risk Assessment   Principal Problem: MDD (major depressive disorder), recurrent episode, severe (HCC) Discharge Diagnoses: Principal Problem:   MDD (major depressive disorder), recurrent episode, severe (HCC)   Total Time spent with patient: 1 hour  Musculoskeletal: Strength & Muscle Tone: within normal limits Gait & Station: normal   Psychiatric Specialty Exam  Presentation  General Appearance:  Appropriate for Environment  Eye Contact: Good  Speech: Normal Rate  Speech Volume: Normal  Handedness: Right   Mood and Affect  Mood: Depressed  Duration of Depression Symptoms: Greater than two weeks  Affect: Flat   Thought Process  Thought Processes: Goal Directed; Linear  Descriptions of Associations:Intact  Orientation:Full (Time, Place and Person)  Thought Content:Logical  History of Schizophrenia/Schizoaffective disorder:No  Duration of Psychotic Symptoms:No data recorded Hallucinations:No data recorded Ideas of Reference:None  Suicidal Thoughts:No data recorded Homicidal Thoughts:No data recorded  Sensorium  Memory: Immediate Good; Remote Good  Judgment: Fair  Insight: Fair   Art therapist  Concentration: Good  Attention Span: Good  Recall: Good  Fund of Knowledge: Good  Language: Good   Psychomotor Activity  Psychomotor Activity:No data recorded  Assets  Assets: Housing; Manufacturing systems engineer; Physical Health; Social Support   Sleep  Sleep:No data recorded Estimated Sleeping Duration (Last 24 Hours): 8.25-9.50 hours  Physical Exam: Physical Exam ROS Blood pressure 113/72, pulse 82, temperature 97.7 F (36.5 C), resp. rate 20, SpO2 99%. There is no height or weight on file to calculate BMI.  Mental Status Per Nursing Assessment::   On Admission:  Suicidal ideation indicated by patient  Demographic Factors:  Male and Adolescent or young adult  Loss Factors: Loss of  significant relationship  Historical Factors: NA  Risk Reduction Factors:   Sense of responsibility to family, Religious beliefs about death, Living with another person, especially a relative, Positive social support, Positive therapeutic relationship, and Positive coping skills or problem solving skills  Continued Clinical Symptoms:  Depression:   Recent sense of peace/wellbeing  Cognitive Features That Contribute To Risk:  None    Suicide Risk:  Minimal: No identifiable suicidal ideation.  Patients presenting with no risk factors but with morbid ruminations; may be classified as minimal risk based on the severity of the depressive symptoms   Follow-up Information     [facility] Follow up.   Why: Your appiontment is scheduled for 12/06/23 at 1:30pm onsite. Contact information: [address]  [address] [place] [address] [phone]                  Hoy CHRISTELLA Pinal, NP 11/29/2023, 7:55 AM

## 2023-11-29 NOTE — Progress Notes (Signed)
   11/28/23 2000  Psych Admission Type (Psych Patients Only)  Admission Status Voluntary  Psychosocial Assessment  Patient Complaints None  Eye Contact Fair  Facial Expression Animated  Affect Appropriate to circumstance  Speech Logical/coherent  Interaction Assertive  Motor Activity Slow  Appearance/Hygiene Unremarkable  Behavior Characteristics Appropriate to situation;Cooperative  Mood Pleasant  Aggressive Behavior  Effect No apparent injury  Thought Process  Coherency WDL  Content WDL  Delusions None reported or observed  Perception WDL  Hallucination None reported or observed  Judgment WDL  Confusion None  Danger to Self  Current suicidal ideation? Denies  Self-Injurious Behavior No self-injurious ideation or behavior indicators observed or expressed   Agreement Not to Harm Self Yes  Danger to Others  Danger to Others None reported or observed

## 2023-11-29 NOTE — Progress Notes (Signed)
 Patient ID: [place], male   DOB: 07-30-2005, 18 y.o.   MRN: [id number]  Patient discharged with mother. Denies SI/HI/AVH. No distress noted

## 2023-11-29 NOTE — Discharge Summary (Signed)
 Physician Discharge Summary Note  Patient:  Frank Holden is an 18 y.o., male MRN:  [id number] DOB:  [birth year] Patient phone:  [phone] (home)  Patient address:   [address] [place] [address],    Date of Admission:  11/24/2023 Date of Discharge: 11/29/23  Reason for Admission:   The patient is a 18 year old single male from [facility], [place] , who presented voluntarily to [facility] accompanied by his mother due to worsening depression, suicidal ideation without current plan or intent, and racing thoughts. He reports experiencing depressive symptoms for the past five years, which he attributes to the death of his great-grandmother. He describes his mental health as "getting worse" and endorses daily feelings of sadness, hopelessness, worthlessness, irritability, anhedonia, crying spells, and self-isolation. He reports significant difficulty sleeping, staying awake until 4-5 AM due to racing thoughts. He also endorses episodes of increased energy and mood lability a few times per week, followed by depression and anxiety.  The patient reports a prior suicide attempt approximately one year ago, involving an attempted hanging with a belt; he states he did not disclose this event to anyone at the time. Also reports more recent attempt but is guarded and does not further discuss. He denies current plan or intent but acknowledges ongoing suicidal thoughts.  The patient reports a prior history of therapy for approximately six months at a local crisis center but felt it was only somewhat helpful. He has no history of psychiatric medication use and denies current engagement with any outpatient mental health provider. He reports he recently ceased marijuana use after previously using intermittently but denies any other illicit substance use or current alcohol use.  He resides with his mother, stepfather, and siblings in a comfortable home environment and describes his relationships with family  as generally positive. He reports that he was primarily raised by his grandparents during childhood. He is not sexually active, has no children, and denies any history of abuse, neglect, or trauma.  The patient reports starting a new job at [employer]) in the next few days and denies work-related stress at this time. He enjoys reading comics and exercising as hobbies. He identifies as [place] and uses prayer as a coping mechanism.  At presentation, the patient is visibly anxious, speaking softly and hesitantly, with leg shaking noted throughout the assessment. His affect is anxious and mood depressed, but he remains cooperative and attentive. No psychosis, paranoia, or disorganized thinking observed during evaluation.His anxiety prevents him from speaking at times but he is cooperative. Struggles in group environments.   Principal Problem: MDD (major depressive disorder), recurrent episode, severe (HCC) Discharge Diagnoses: Principal Problem:   MDD (major depressive disorder), recurrent episode, severe (HCC)  Psychiatric History:  Information collected from pt   Prev Dx/Sx: none Current Psych Provider: none Home Meds (current): none Previous Med Trials: none Therapy: prior   Prior Psych Hospitalization: none  Prior Self Harm: See above Prior Violence: none   Family Psych History: none reported Family Hx suicide: none   Social History:  Developmental Hx: none reported Educational Hx: high school graduate Occupational Hx: temp Legal Hx: none Living Situation: with family  Spiritual Hx: chirstian Access to weapons/lethal means: none    Substance History Alcohol: denie    Illicit drugs: denies Prescription drug abuse: none Rehab hx: none Social History:  Social History   Substance and Sexual Activity  Alcohol Use No     Social History   Substance and Sexual Activity  Drug  Use No    Social History   Socioeconomic History   Marital status: Single     Spouse name: Not on file   Number of children: Not on file   Years of education: Not on file   Highest education level: Not on file  Occupational History   Not on file  Tobacco Use   Smoking status: Never   Smokeless tobacco: Never  Substance and Sexual Activity   Alcohol use: No   Drug use: No   Sexual activity: Not on file  Other Topics Concern   Not on file  Social History Narrative   Lives at home with great-grandmother [name], has custody).   His mother lives in [place], does not see him often.   Do not know father.    No pets.          Social Drivers of Corporate investment banker Strain: Not on file  Food Insecurity: No Food Insecurity (11/24/2023)   Hunger Vital Sign    Worried About Running Out of Food in the Last Year: Never true    Ran Out of Food in the Last Year: Never true  Transportation Needs: No Transportation Needs (11/24/2023)   PRAPARE - Administrator, Civil Service (Medical): No    Lack of Transportation (Non-Medical): No  Physical Activity: Not on file  Stress: Not on file  Social Connections: Moderately Isolated (11/24/2023)   Social Connection and Isolation Panel    Frequency of Communication with Friends and Family: Never    Frequency of Social Gatherings with Friends and Family: Never    Attends Religious Services: Never    Database administrator or Organizations: Yes    Attends Banker Meetings: Never    Marital Status: Married   Past Medical History: History reviewed. No pertinent past medical history.  Past Surgical History:  Procedure Laterality Date   LAPAROSCOPIC APPENDECTOMY N/A 12/05/2020   Procedure: APPENDECTOMY LAPAROSCOPIC;  Surgeon: Sebastian Moles, MD;  Location: [facility] OR;  Service: General;  Laterality: N/A;   Family History: History reviewed. No pertinent family history.  Hospital Course:   Patient was brought to the emergency department by his mother for psychiatric evaluation due to worsening  depressive symptoms, generalized anxiety, racing thoughts, and passive suicidal ideation without plan or intent. These symptoms occurred in the context of unresolved grief, probable mood instability, limited coping skills, poor social engagement, and minimal prior psychiatric follow-up. His clinical picture was notable for chronic depression, episodic mood elevation, sleep disturbance, and a prior suicide attempt.   During admission, the patient was initially guarded but gradually engaged in therapeutic programming. Psychotropic medications were initiated with noted benefit. He consistently denied any current suicidal ideation, homicidal ideation, hallucinations, or delusional thought content during the latter part of his stay. There were no signs of psychosis or mania observed. The patient demonstrated improved mood, more adaptive coping skills, and increased insight into his mental health needs. He expressed appreciation for treatment and stated, "It's helped me a lot to be in this place," indicating that a change in environment and supportive care contributed to stabilization.  The patient presents with stable mood, logical and goal-directed thought processes, good eye contact, and appropriate affect. Sleep and appetite are reported as good. He denies any current safety concerns and shows no signs of mood lability, psychosis, or behavioral dysregulation. Detailed risk assessment is complete based on clinical exam and individual risk factors and acute suicide risk is low  and acute violence risk is low.    Patient will discharge home with his mother, where he has a stable and supportive living environment. He has been connected with outpatient psychiatric services for medication management and therapy. Patient expresses motivation to continue his recovery, seek employment, and utilize the coping strategies learned during this admission.  Risk and Protective Factors: Risk Factors: History of depression,  prior suicide attempt, unresolved grief Protective Factors: Supportive home with mother, medication compliance, absence of psychosis, improved insight, stable mood, future-oriented thinking, no recent suicidal or self-harming behavior, connected with outpatient services   Prognosis: Good, with continued outpatient treatment and social support.   Currently, all modifiable risk of harm to self/harm to others have been addressed and patient is no longer appropriate for the acute inpatient setting and is able to continue treatment for mental health needs in the community with the supports as indicated below.  Patient is educated and verbalized understanding of discharge plan of care including medications, follow-up appointments, mental health resources and further crisis services in the community.  He is instructed to call 911 or present to the nearest emergency room should he experience any decompensation in mood, disturbance of bowel or return of suicidal/homicidal ideations.  Patient verbalizes understanding of this education and agrees to this plan of care    Psychiatric Specialty Exam:  Presentation  General Appearance:  Appropriate for Environment  Eye Contact: Good  Speech: Normal Rate  Speech Volume: Normal    Mood and Affect  Mood: euthymic  Affect: even   Thought Process  Thought Processes: Goal Directed; Linear  Descriptions of Associations:Intact  Orientation:Full (Time, Place and Person)  Thought Content:Logical  Hallucinations:none Ideas of Reference:None  Suicidal Thoughts:denies Homicidal Thought denies  Sensorium  Memory: Immediate Good; Remote Good  Judgment: Fair  Insight: Fair   Art therapist  Concentration: Good  Attention Span: Good  Recall: Good  Fund of Knowledge: Good  Language: Good   Psychomotor Activity  Psychomotor Activity:No data recorded Musculoskeletal: Strength & Muscle Tone: within normal  limits Gait & Station: normal Assets  Assets: Housing; Manufacturing systems engineer; Physical Health; Social Support   Sleep  Sleep:No data recorded   Physical Exam: Physical Exam ROS Blood pressure 113/72, pulse 82, temperature 97.7 F (36.5 C), resp. rate 20, SpO2 99%. There is no height or weight on file to calculate BMI.   Social History   Tobacco Use  Smoking Status Never  Smokeless Tobacco Never   Tobacco Cessation:  N/A, patient does not currently use tobacco products   Blood Alcohol level:  Lab Results  Component Value Date   [facility] <15 11/23/2023    Metabolic Disorder Labs:  Lab Results  Component Value Date   HGBA1C 5.4 11/28/2023   MPG 108.28 11/28/2023   MPG 108.28 11/27/2023   No results found for: PROLACTIN Lab Results  Component Value Date   CHOL 133 11/28/2023   TRIG 59 11/28/2023   HDL 38 (L) 11/28/2023   CHOLHDL 3.5 11/28/2023   VLDL 12 11/28/2023   LDLCALC 83 11/28/2023   LDLCALC 84 11/27/2023    See Psychiatric Specialty Exam and Suicide Risk Assessment completed by Attending Physician prior to discharge.  Discharge destination:  Home  Is patient on multiple antipsychotic therapies at discharge:  No   Has Patient had three or more failed trials of antipsychotic monotherapy by history:  No  Recommended Plan for Multiple Antipsychotic Therapies: NA  Discharge Instructions     Diet - low sodium heart healthy  Complete by: As directed    Increase activity slowly   Complete by: As directed       Allergies as of 11/29/2023   No Known Allergies      Medication List     STOP taking these medications    acetaminophen  500 MG tablet Commonly known as: TYLENOL    loratadine  10 MG tablet Commonly known as: CLARITIN    triamcinolone  ointment 0.5 % Commonly known as: KENALOG        TAKE these medications      Indication  ARIPiprazole  5 MG tablet Commonly known as: ABILIFY  Take 1 tablet (5 mg total) by mouth daily.   Indication: Major Depressive Disorder   escitalopram  10 MG tablet Commonly known as: LEXAPRO  Take 1 tablet (10 mg total) by mouth daily.  Indication: Major Depressive Disorder        Follow-up Information     [facility] Follow up.   Why: Your appiontment is scheduled for 12/06/23 at 1:30pm onsite. Contact information: [address]  [address] [place] [address] [phone]                   Signed: Hoy CHRISTELLA Pinal, NP 11/29/2023, 8:26 AM

## 2023-11-29 NOTE — Group Note (Signed)
 Date:  11/29/2023 Time:  10:45 AM  Group Topic/Focus:  Goals Group:   The focus of this group is to help patients establish daily goals to achieve during treatment and discuss how the patient can incorporate goal setting into their daily lives to aide in recovery.    Participation Level:  Active  Participation Quality:  Appropriate  Affect:  Appropriate  Cognitive:  Appropriate  Insight: Appropriate  Engagement in Group:  Engaged  Modes of Intervention:  Activity and Discussion  Additional Comments:    Jimmie Dattilio L Josemanuel Eakins 11/29/2023, 10:45 AM

## 2023-11-29 NOTE — Plan of Care (Signed)
   Problem: Education: Goal: Knowledge of [facility] Education information/materials will improve Outcome: Progressing Goal: Emotional status will improve Outcome: Progressing Goal: Mental status will improve Outcome: Progressing Goal: Verbalization of understanding the information provided will improve Outcome: Progressing

## 2023-11-29 NOTE — Progress Notes (Signed)
  [facility] Adult Case Management Discharge Plan :  Will you be returning to the same living situation after discharge:  Yes,  pt plans to return home upon discharge. At discharge, do you have transportation home?: Yes,  pt mother to make arrangements for transportation. Do you have the ability to pay for your medications: Yes,  [place] MEDICAID PREPAID HEALTH PLAN / [place] MEDICAID [facility]  Release of information consent forms completed and in the chart;  Patient's signature needed at discharge.  Patient to Follow up at:  Follow-up Information     [facility] Follow up.   Why: Your appiontment is scheduled for 12/06/23 at 1:30pm onsite. Contact information: [address]  [address] [place] [address] [phone]                 Next level of care provider has access to [facility] Link:no  Safety Planning and Suicide Prevention discussed: Yes,  SPE completed with pt.      Has patient been referred to the Quitline?: Patient does not use tobacco/nicotine products  Patient has been referred for addiction treatment: No known substance use disorder.  Nadara JONELLE Fam, LCSW 11/29/2023, 9:38 AM

## 2023-11-29 NOTE — Plan of Care (Signed)

## 2023-12-06 DIAGNOSIS — F331 Major depressive disorder, recurrent, moderate: Secondary | ICD-10-CM | POA: Diagnosis not present

## 2024-02-10 ENCOUNTER — Ambulatory Visit (HOSPITAL_COMMUNITY)
Admission: EM | Admit: 2024-02-10 | Discharge: 2024-02-10 | Disposition: A | Discharge status: Home / Self Care | Payer: MEDICAID

## 2024-02-10 NOTE — Progress Notes (Signed)
   02/10/24 1822  [facility] Triage Screening (Walk-ins at [facility] only)  How Did You Hear About Us ? Self  What Is the Reason for Your Visit/Call Today? Pt is a 18 yo male who presented voluntarily and unaccompanied due to worsening depression and SI. Pt stated that he has been feel depressed over the past week and had SI for the past few days. Pt thought about intentionally overdosing on dietary supplements he has. Pt stated that about 3 days ago he thought about cutting his wrists with a pocketknife. Hx of MDD. Pt stated that he does not have any OP psychiatric providers currently and is not prescribed any psychiatric medications. Pt denied HI, NSSH, AVH, paranoia and any substance use. Pt stated that he was last psychiatrically hospitalized when he was seen at [facility] in July. He stated he was admitted to [facility]  How Long Has This Been Causing You Problems? 1 wk - 1 month  Have You Recently Had Any Thoughts About Hurting Yourself? Yes  How long ago did you have thoughts about hurting yourself? over the last few days  Are You Planning to Commit Suicide/Harm Yourself At This time? No  Have you Recently Had Thoughts About Hurting Someone Sherral? Yes  How long ago did you have thoughts of harming others? Pt stated that he thought about meeting someone he met online to :fight it out.  Are You Planning To Harm Someone At This Time? No  Physical Abuse Denies  Verbal Abuse Denies  Sexual Abuse Denies  Exploitation of patient/patient's resources Denies  Self-Neglect Denies  Possible abuse reported to:  (na)  Are you currently experiencing any auditory, visual or other hallucinations? No  Have You Used Any Alcohol or Drugs in the Past 24 Hours? No  Do you have any current medical co-morbidities that require immediate attention? No  Clinician description of patient physical appearance/behavior: cooperative, calm, alert and fully oriented. depressed mood with flat affect, normal speech, eye contact and movement  What  Do You Feel Would Help You the Most Today? Treatment for Depression or other mood problem  If access to [facility] Urgent Care was not available, would you have sought care in the Emergency Department? Yes  Determination of Need Urgent (48 hours)  Options For Referral Facility-Based Crisis  Determination of Need filed? Yes

## 2024-02-10 NOTE — Progress Notes (Signed)
   02/10/24 1845  [place] Suicide Severity Rating Scale  1. In the past month -  Have you wished you were dead or wished you could go to sleep and not wake up? Yes  2. In the past month - Have you actually had any thoughts of killing yourself? Yes  3. In the past month - Have you been thinking about how you might kill yourself? Yes  4. In the past month - Have you had these thoughts and had some intention of acting on them? Yes  5. In the past month - Have you started to work out or worked out the details of how to kill yourself? Do you intend to carry out this plan? No  6. Have you ever done anything, started to do anything, or prepared to do anything to end your life? Yes  7. Was this within the past three months? No  C-SSRS RISK CATEGORY High Risk  Patient location: [facility] Urgent Care/Facility Based Crisis Center  [facility] Urgent Care/Facility Based Crisis Center Suicide Precautions Interventions  [facility] Suicide Precautions Interventions High Risk Interventions
